# Patient Record
Sex: Female | Born: 1960 | Race: Black or African American | Hispanic: No | Marital: Married | Smoking: Former smoker
Health system: Southern US, Community
[De-identification: ages and names within clinical notes are randomized; demographics above are authoritative.]

## PROBLEM LIST (undated history)

## (undated) DIAGNOSIS — R911 Solitary pulmonary nodule: Secondary | ICD-10-CM

## (undated) DIAGNOSIS — K869 Disease of pancreas, unspecified: Secondary | ICD-10-CM

## (undated) DIAGNOSIS — R51 Headache: Secondary | ICD-10-CM

## (undated) DIAGNOSIS — Z860100 Personal history of colon polyps, unspecified: Secondary | ICD-10-CM

## (undated) DIAGNOSIS — A4902 Methicillin resistant Staphylococcus aureus infection, unspecified site: Secondary | ICD-10-CM

## (undated) DIAGNOSIS — I10 Essential (primary) hypertension: Secondary | ICD-10-CM

## (undated) DIAGNOSIS — M2550 Pain in unspecified joint: Secondary | ICD-10-CM

## (undated) DIAGNOSIS — F329 Major depressive disorder, single episode, unspecified: Secondary | ICD-10-CM

## (undated) DIAGNOSIS — M797 Fibromyalgia: Secondary | ICD-10-CM

## (undated) DIAGNOSIS — M1990 Unspecified osteoarthritis, unspecified site: Secondary | ICD-10-CM

## (undated) DIAGNOSIS — M961 Postlaminectomy syndrome, not elsewhere classified: Secondary | ICD-10-CM

## (undated) DIAGNOSIS — M5413 Radiculopathy, cervicothoracic region: Secondary | ICD-10-CM

## (undated) DIAGNOSIS — G4700 Insomnia, unspecified: Secondary | ICD-10-CM

## (undated) DIAGNOSIS — F419 Anxiety disorder, unspecified: Secondary | ICD-10-CM

## (undated) DIAGNOSIS — K754 Autoimmune hepatitis: Secondary | ICD-10-CM

## (undated) DIAGNOSIS — C189 Malignant neoplasm of colon, unspecified: Secondary | ICD-10-CM

## (undated) DIAGNOSIS — M654 Radial styloid tenosynovitis [de Quervain]: Secondary | ICD-10-CM

## (undated) DIAGNOSIS — R531 Weakness: Secondary | ICD-10-CM

## (undated) DIAGNOSIS — F32A Depression, unspecified: Secondary | ICD-10-CM

## (undated) DIAGNOSIS — E041 Nontoxic single thyroid nodule: Secondary | ICD-10-CM

## (undated) DIAGNOSIS — E119 Type 2 diabetes mellitus without complications: Secondary | ICD-10-CM

## (undated) DIAGNOSIS — C3412 Malignant neoplasm of upper lobe, left bronchus or lung: Secondary | ICD-10-CM

## (undated) DIAGNOSIS — Z91148 Patient's other noncompliance with medication regimen for other reason: Secondary | ICD-10-CM

## (undated) DIAGNOSIS — N289 Disorder of kidney and ureter, unspecified: Secondary | ICD-10-CM

## (undated) DIAGNOSIS — G8929 Other chronic pain: Secondary | ICD-10-CM

## (undated) HISTORY — DX: Disorder of kidney and ureter, unspecified: N28.9

## (undated) HISTORY — PX: TRIGGER FINGER RELEASE: SHX641

## (undated) HISTORY — PX: INCISE AND DRAIN ABCESS: PRO64

## (undated) HISTORY — PX: CARPAL TUNNEL RELEASE: SHX101

## (undated) HISTORY — PX: PROCTOSCOPY TRANSANAL RESECTION OF RECTAL TUMOR WITH TAMIS SYSTEM: SHX6665

## (undated) HISTORY — PX: LOBECTOMY: SHX5089

## (undated) HISTORY — PX: CERVICAL SPINE SURGERY: SHX589

## (undated) HISTORY — PX: XI ROBOTIC ASSISTED THORACOSCOPY- SEGMENTECTOMY: SHX6881

## (undated) HISTORY — PX: COLONOSCOPY: SHX174

## (undated) HISTORY — PX: BRONCHOSCOPY: SUR163

## (undated) HISTORY — PX: HEMORRHOID SURGERY: SHX153

## (undated) HISTORY — PX: TONSILLECTOMY: SUR1361

## (undated) HISTORY — DX: Fibromyalgia: M79.7

## (undated) HISTORY — DX: Methicillin resistant Staphylococcus aureus infection, unspecified site: A49.02

## (undated) HISTORY — DX: Disease of pancreas, unspecified: K86.9

## (undated) HISTORY — DX: Solitary pulmonary nodule: R91.1

## (undated) HISTORY — DX: Malignant neoplasm of colon, unspecified: C18.9

## (undated) HISTORY — DX: Nontoxic single thyroid nodule: E04.1

## (undated) HISTORY — DX: Postlaminectomy syndrome, not elsewhere classified: M96.1

## (undated) HISTORY — PX: OOPHORECTOMY: SHX86

## (undated) HISTORY — DX: Other chronic pain: G89.29

## (undated) HISTORY — DX: Malignant neoplasm of upper lobe, left bronchus or lung: C34.12

## (undated) HISTORY — DX: Type 2 diabetes mellitus without complications: E11.9

## (undated) HISTORY — DX: Essential (primary) hypertension: I10

## (undated) HISTORY — DX: Radiculopathy, cervicothoracic region: M54.13

## (undated) HISTORY — DX: Autoimmune hepatitis: K75.4

## (undated) HISTORY — PX: APPENDECTOMY: SHX54

## (undated) HISTORY — DX: Radial styloid tenosynovitis (de quervain): M65.4

## (undated) HISTORY — DX: Patient's other noncompliance with medication regimen for other reason: Z91.148

## (undated) HISTORY — PX: UPPER GASTROINTESTINAL ENDOSCOPY: SHX188

## (undated) HISTORY — DX: Insomnia, unspecified: G47.00

---

## 1978-09-12 HISTORY — PX: KNEE ARTHROSCOPY: SUR90

## 2000-09-12 HISTORY — PX: ABDOMINAL HYSTERECTOMY: SHX81

## 2003-07-16 ENCOUNTER — Encounter: Admission: RE | Admit: 2003-07-16 | Discharge: 2003-07-16 | Payer: Self-pay | Admitting: Unknown Physician Specialty

## 2004-03-09 ENCOUNTER — Ambulatory Visit (HOSPITAL_COMMUNITY): Admission: RE | Admit: 2004-03-09 | Discharge: 2004-03-11 | Payer: Self-pay | Admitting: Neurological Surgery

## 2004-04-06 ENCOUNTER — Encounter: Admission: RE | Admit: 2004-04-06 | Discharge: 2004-04-06 | Payer: Self-pay | Admitting: Neurological Surgery

## 2004-05-18 ENCOUNTER — Encounter: Admission: RE | Admit: 2004-05-18 | Discharge: 2004-05-18 | Payer: Self-pay | Admitting: Neurological Surgery

## 2008-01-25 ENCOUNTER — Encounter: Admission: RE | Admit: 2008-01-25 | Discharge: 2008-01-25 | Payer: Self-pay | Admitting: Neurological Surgery

## 2008-01-30 ENCOUNTER — Ambulatory Visit (HOSPITAL_COMMUNITY): Admission: RE | Admit: 2008-01-30 | Discharge: 2008-01-31 | Payer: Self-pay | Admitting: Neurological Surgery

## 2008-03-03 ENCOUNTER — Encounter: Admission: RE | Admit: 2008-03-03 | Discharge: 2008-03-03 | Payer: Self-pay | Admitting: Neurological Surgery

## 2008-05-20 ENCOUNTER — Encounter: Admission: RE | Admit: 2008-05-20 | Discharge: 2008-05-20 | Payer: Self-pay | Admitting: Orthopedic Surgery

## 2010-10-02 ENCOUNTER — Encounter: Payer: Self-pay | Admitting: Neurological Surgery

## 2010-10-04 ENCOUNTER — Encounter: Payer: Self-pay | Admitting: Neurological Surgery

## 2011-01-25 NOTE — Op Note (Signed)
NAME:  Lynn Richardson, Lynn Richardson NO.:  [id number]   MEDICAL RECORD NO.:  [id number]          PATIENT TYPE:  OIB   LOCATION:  3535                         FACILITY:  [facility]   PHYSICIAN:  Tia Alert, MD     DATE OF BIRTH:  1961/03/31   DATE OF PROCEDURE:  01/30/2008  DATE OF DISCHARGE:                               OPERATIVE REPORT   PREOPERATIVE DIAGNOSIS:  Cervical spondylosis with cervical disk  herniation at C5-C6 with right C6 radiculopathy.   POSTOPERATIVE DIAGNOSIS:  Cervical spondylosis with cervical disk  herniation at C5-C6 with right C6 radiculopathy.   PROCEDURES:  1. Decompressive anterior cervical diskectomy at C5-C6.  2. Anterior cervical arthrodesis at C5-C6, utilizing a 7-mm      corticocancellous allograft.  3. Anterior cervical plating at C5-C6, utilizing a 23-mm Zephir plate.   SURGEON:  Tia Alert, MD   ASSISTANT:  Donalee Citrin, MD   ANESTHESIA:  General endotracheal.   COMPLICATIONS:  None apparent.   INDICATIONS FOR PROCEDURE:  [name] is a very pleasant 50-  year-old white female who had undergone a previous discectomy at C3-C4,  who presented with severe right arm pain.  She had plain films, which  suggested a solid fusion at C3-C4.  She had an MRI, which showed a large  disk herniation to the right at C5-C6 with severe compression of the  right C6 nerve root.  There was moderate canal stenosis associated with  this.  I recommended an anterior cervical discectomy fusion plating at  C5-C6.  She understood the risks, benefits, and expected outcome and  wished to proceed.   DESCRIPTION OF PROCEDURE:  The patient was taken to the operating room,  and after induction of adequate generalized endotracheal anesthesia, she  was placed in the supine position on the operating room table.  Her  right anterior cervical region was prepped with DuraPrep and then draped  in the usual sterile fashion.  A 3 mL of local anesthesia was  injected,  and a transverse incision was made through the right of midline and  carried down through the platysma muscle, which was elevated, opened,  and then undermined with Metzenbaum scissors.  I then dissected a plane  medial to the sternocleidomastoid muscle and internal carotid artery and  lateral to the trachea and esophagus to expose C5-C6.  Intraoperative  fluoroscopy confirmed a level at C5-C6, and then the longus colli  muscles were taken down bilaterally, and the Shadow-Line retractors were  placed under this to expose C5-C6.  The annulus was incised, and the  initial discectomy was performed with pituitary rongeurs and curettes,  then used a high-speed drill to drill the endplates down to the level of  the posterior longitudinal ligament.  I drilled to a height of 7 mm and  then brought in the operating microscope, opened the posterior  longitudinal ligament with a nerve hook and then removed it while  undercutting the bodies of C5 and C6, removing the posterior osteophytes  as I marched along the endplates, identified the pedicles bilaterally,  marched along the pedicles to identify the nerve roots.  But, spent  considerable time on the  patient's right side because of her right-sided  pain.  I identified the C6 nerve root and followed it out towards distal  to the pedicle, and the nerve was well decompressed.  I then palpated  with a nerve hook in a circumferential fashion to assure adequate  decompression as best I could with visualization and palpation.  I then  measured the interspace to be 7 mm using a 7-mm corticocancellous  allograft and tapped this into position at C5-C6.  I then used a Zephir  plate and placed two 13-mm variable angle screws into the bodies of C5  and C6, and these locked in the plate by the sliding mechanism on the  plate.  I then irrigated with saline solution containing bacitracin,  dried all bleeding points with bipolar cautery, and once  meticulous  hemostasis was achieved, I closed the platysma with 3-0 Vicryl, closed  the subcuticular tissue with 3-0 Vicryl, and closed the skin with  benzoin and Steri-Strips.  The drapes were removed.  Sterile dressing  was applied.  The patient was awakened from general anesthesia and  transferred to the recovery room in stable condition.  At the end of the  procedure, all sponge, needle, and instrument counts were correct.      Tia Alert, MD  Electronically Signed     DSJ/MEDQ  D:  01/30/2008  T:  01/31/2008  Job:  [id number]

## 2011-01-28 NOTE — Op Note (Signed)
NAME:  Lynn Richardson, Lynn Richardson             ACCOUNT NO.:  [id number]   MEDICAL RECORD NO.:  [id number]                   PATIENT TYPE:  OIB   LOCATION:  2899                                 FACILITY:  [facility]   PHYSICIAN:  Tia Alert, MD                  DATE OF BIRTH:  1961-07-19   DATE OF PROCEDURE:  DATE OF DISCHARGE:                                 OPERATIVE REPORT   PREOPERATIVE DIAGNOSIS:  Cervical spondylosis with cervical disk herniation  C3-4 with neck and shoulder pain.   POSTOPERATIVE DIAGNOSIS:  Cervical spondylosis with cervical disk herniation  C3-4 with neck and shoulder pain.   OPERATION PERFORMED:  1. Decompressive anterior cervical diskectomy for central canal and nerve     root decompression.  2. Anterior cervical arthrodesis, C3-4 utilizing Tutogen allograft bone.  3. Anterior cervical plating C3-4 utilizing a 22.5 mm Zephyr plate.   SURGEON:  Tia Alert, MD   ASSISTANT:  Kathaleen Maser. Pool, M.D.   ANESTHESIA:  General endotracheal.   COMPLICATIONS:  None apparent.   INDICATIONS FOR PROCEDURE:  Ms. Lynn Richardson is a 50 year old black  female with a long history of neck pain with bilateral shoulder pain, right  greater than left.  She had tried medical management for quite some time  without significant relief.  She has been to the pain management center  under the care of Ewing Schlein, M.D. and had had multiple injections.  She  had tried medications for quite some time.  Her quality of life was severely  affected by her pain.  She had an MRI and then a CT myelogram which showed a  cervical disk herniation at C3-4 causing moderate spinal stenosis.  I  recommended anterior cervical diskectomy with fusion and plating at C3-4.  She understood the risks and benefits and expected outcomes and the  alternatives and wished to proceed.   DESCRIPTION OF PROCEDURE:  The patient was taken to the operating room and  after induction of adequate general  endotracheal anesthesia, she was placed  in supine position on the operating table.  Her right anterior cervical  region was prepped with DuraPrep and then draped in the usual sterile  fashion.  5 mL of local anesthesia was injected and then an incision was  made to the right of midline and carried down to the platysma.  The platysma  was elevated, opened and undermined with Metzenbaum scissors.  I then  dissected into plane medial to the sternocleidomastoid muscle, internal  carotid artery and lateral to the trachea and esophagus to expose the  anterior cervical spine at C3-4.  Intraoperative fluoroscopy confirmed our  level and then the longus colli muscles were taken down bilaterally to  expose the anterior cervical spine at C3-4.  The annulus was incised with a  15 blade scalpel and initial diskectomy was done with a pituitary rongeurs  and curved curets.  We dissected down to the posterior longitudinal  ligament.  We  brought in the high speed air powered Black Max drill and  drilled down the end places for later arthrodesis.  We drilled down to the  level of the posterior longitudinal ligament and brought in the operating  room microscope.  Under the operating microscope the posterior longitudinal  ligament was opened and removed in a circumferential fashion along with the  posterior osteophytes with a Kerrison punches.  Once the decompression was  complete, we turned our attention to the arthrodesis.  We measured the  interspace to be 7 mm and used a 7 mm Tutogen allograft and tapped this into  position at C3-4.  We then used a 22.5 mm Zephyr plate and placed this at C3-  4 with two 13 mm screws in the body of C3 and two in the body of C4 and then  locked these into position with a locking mechanism.  We then irrigated with  copious amounts of bacitracin containing saline solution, dried all bleeding  points with bipolar cautery, closed the platysma with interrupted 3-0  Vicryl,  closed the subcuticular tissue with 3-0 Vicryl and closed the  subcuticular tissue with 3-0 Vicryl and closed the skin with Dermabond.  The  drapes were removed.  The patient was awakened from general anesthesia and  transported to recovery room in stable condition.  At the end of the  procedure all sponge, needle and instrument counts were correct.                                               Tia Alert, MD    DSJ/MEDQ  D:  03/09/2004  T:  03/09/2004  Job:  [id number]

## 2011-06-08 LAB — DIFFERENTIAL
Basophils Absolute: 0.1
Basophils Relative: 1
Eosinophils Absolute: 0.2
Eosinophils Relative: 2
Lymphocytes Relative: 34
Lymphs Abs: 4.1 — ABNORMAL HIGH
Monocytes Absolute: 0.8
Monocytes Relative: 7
Neutro Abs: 6.9
Neutrophils Relative %: 57

## 2011-06-08 LAB — CBC
HCT: 39.6
Hemoglobin: 13.7
MCHC: 34.6
MCV: 85.1
Platelets: 223
RBC: 4.66
RDW: 12.9
WBC: 12.1 — ABNORMAL HIGH

## 2011-06-08 LAB — BASIC METABOLIC PANEL
BUN: 5 — ABNORMAL LOW
CO2: 28
Calcium: 9.5
Chloride: 106
Creatinine, Ser: 0.94
GFR calc Af Amer: >60
GFR calc non Af Amer: >60
Glucose, Bld: 115 — ABNORMAL HIGH
Potassium: 3.7
Sodium: 144

## 2011-06-08 LAB — PROTIME-INR
INR: 0.9
Prothrombin Time: 12.1

## 2011-06-08 LAB — APTT: aPTT: 32

## 2012-01-04 ENCOUNTER — Other Ambulatory Visit (HOSPITAL_COMMUNITY): Payer: Self-pay | Admitting: Neurological Surgery

## 2012-01-04 DIAGNOSIS — M542 Cervicalgia: Secondary | ICD-10-CM

## 2012-01-04 DIAGNOSIS — M5412 Radiculopathy, cervical region: Secondary | ICD-10-CM

## 2012-01-06 ENCOUNTER — Ambulatory Visit (HOSPITAL_COMMUNITY)
Admission: RE | Admit: 2012-01-06 | Discharge: 2012-01-06 | Disposition: A | Discharge status: Home / Self Care | Payer: BC Managed Care – PPO | Source: Ambulatory Visit | Attending: Neurological Surgery | Admitting: Neurological Surgery

## 2012-01-06 DIAGNOSIS — Z981 Arthrodesis status: Secondary | ICD-10-CM | POA: Insufficient documentation

## 2012-01-06 DIAGNOSIS — M5412 Radiculopathy, cervical region: Secondary | ICD-10-CM

## 2012-01-06 DIAGNOSIS — M542 Cervicalgia: Secondary | ICD-10-CM

## 2012-01-06 DIAGNOSIS — M5020 Other cervical disc displacement, unspecified cervical region: Secondary | ICD-10-CM | POA: Insufficient documentation

## 2012-01-16 ENCOUNTER — Encounter (HOSPITAL_COMMUNITY): Payer: Self-pay | Admitting: Respiratory Therapy

## 2012-01-17 ENCOUNTER — Other Ambulatory Visit: Payer: Self-pay | Admitting: Neurological Surgery

## 2012-01-18 ENCOUNTER — Encounter (HOSPITAL_COMMUNITY): Payer: Self-pay

## 2012-01-18 ENCOUNTER — Encounter (HOSPITAL_COMMUNITY)
Admission: RE | Admit: 2012-01-18 | Discharge: 2012-01-18 | Disposition: A | Discharge status: Home / Self Care | Payer: BC Managed Care – PPO | Source: Ambulatory Visit | Attending: Neurological Surgery | Admitting: Neurological Surgery

## 2012-01-18 HISTORY — DX: Depression, unspecified: F32.A

## 2012-01-18 HISTORY — DX: Unspecified osteoarthritis, unspecified site: M19.90

## 2012-01-18 HISTORY — DX: Major depressive disorder, single episode, unspecified: F32.9

## 2012-01-18 HISTORY — DX: Anxiety disorder, unspecified: F41.9

## 2012-01-18 LAB — CBC
HCT: 38.5 % (ref 36.0–46.0)
MCHC: 33.8 g/dL (ref 30.0–36.0)
MCV: 83.2 fL (ref 78.0–100.0)
Platelets: 202 10*3/uL (ref 150–400)
RDW: 13.3 % (ref 11.5–15.5)

## 2012-01-18 LAB — DIFFERENTIAL
Basophils Absolute: 0 10*3/uL (ref 0.0–0.1)
Basophils Relative: 0 % (ref 0–1)
Eosinophils Relative: 3 % (ref 0–5)
Monocytes Absolute: 0.6 10*3/uL (ref 0.1–1.0)

## 2012-01-18 LAB — BASIC METABOLIC PANEL
BUN: 7 mg/dL (ref 6–23)
CO2: 27 mEq/L (ref 19–32)
Calcium: 9.3 mg/dL (ref 8.4–10.5)
Chloride: 101 mEq/L (ref 96–112)
Creatinine, Ser: 0.92 mg/dL (ref 0.50–1.10)
GFR calc Af Amer: 82 mL/min — ABNORMAL LOW (ref >90)

## 2012-01-18 NOTE — Pre-Procedure Instructions (Signed)
20 Lynn Richardson  01/18/2012   Your procedure is scheduled on:  01/20/12 (Friday)  Report to [facility] at 05:30 AM.  Call this number if you have problems the morning of surgery: [phone]   Remember:   Do not eat food:After Midnight.  May have clear liquids: up to 4 Hours before arrival.  Clear liquids include soda, tea, black coffee, apple or grape juice, broth.  Take these medicines the morning of surgery with A SIP OF WATER: Percocet, Valium, Estradiol    Do not wear jewelry, make-up or nail polish.  Do not wear lotions, powders, or perfumes. You may wear deodorant.  Do not shave 48 hours prior to surgery.  Do not bring valuables to the hospital.  Contacts, dentures or bridgework may not be worn into surgery.  Leave suitcase in the car. After surgery it may be brought to your room.  For patients admitted to the hospital, checkout time is 11:00 AM the day of discharge.   Patients discharged the day of surgery will not be allowed to drive home.  Name and phone number of your driver: FAMILY  Special Instructions: CHG Shower Use Special Wash: 1/2 bottle night before surgery and 1/2 bottle morning of surgery.   Please read over the following fact sheets that you were given: Pain Booklet, Coughing and Deep Breathing, MRSA Information and Surgical Site Infection Prevention

## 2012-01-19 MED ORDER — CEFAZOLIN SODIUM-DEXTROSE 2-3 GM-% IV SOLR
2.0000 g | INTRAVENOUS | Status: AC
  Administered 2012-01-20: 2 g via INTRAVENOUS
  Filled 2012-01-19 (×2): qty 50

## 2012-01-20 ENCOUNTER — Encounter (HOSPITAL_COMMUNITY): Payer: Self-pay | Admitting: *Deleted

## 2012-01-20 ENCOUNTER — Encounter (HOSPITAL_COMMUNITY): Payer: Self-pay | Admitting: Anesthesiology

## 2012-01-20 ENCOUNTER — Ambulatory Visit (HOSPITAL_COMMUNITY): Payer: BC Managed Care – PPO

## 2012-01-20 ENCOUNTER — Encounter (HOSPITAL_COMMUNITY)
Admission: RE | Disposition: A | Discharge status: Home / Self Care | Payer: Self-pay | Source: Ambulatory Visit | Attending: Neurological Surgery

## 2012-01-20 ENCOUNTER — Ambulatory Visit (HOSPITAL_COMMUNITY)
Admission: RE | Admit: 2012-01-20 | Discharge: 2012-01-21 | Disposition: A | Discharge status: Home / Self Care | Payer: BC Managed Care – PPO | Source: Ambulatory Visit | Attending: Neurological Surgery | Admitting: Neurological Surgery

## 2012-01-20 ENCOUNTER — Ambulatory Visit (HOSPITAL_COMMUNITY): Payer: BC Managed Care – PPO | Admitting: Anesthesiology

## 2012-01-20 DIAGNOSIS — Z87891 Personal history of nicotine dependence: Secondary | ICD-10-CM | POA: Insufficient documentation

## 2012-01-20 DIAGNOSIS — R0602 Shortness of breath: Secondary | ICD-10-CM | POA: Insufficient documentation

## 2012-01-20 DIAGNOSIS — Z01810 Encounter for preprocedural cardiovascular examination: Secondary | ICD-10-CM | POA: Insufficient documentation

## 2012-01-20 DIAGNOSIS — M5020 Other cervical disc displacement, unspecified cervical region: Secondary | ICD-10-CM | POA: Insufficient documentation

## 2012-01-20 DIAGNOSIS — Z01812 Encounter for preprocedural laboratory examination: Secondary | ICD-10-CM | POA: Insufficient documentation

## 2012-01-20 DIAGNOSIS — Z01818 Encounter for other preprocedural examination: Secondary | ICD-10-CM | POA: Insufficient documentation

## 2012-01-20 HISTORY — PX: POSTERIOR CERVICAL LAMINECTOMY: SHX2248

## 2012-01-20 SURGERY — POSTERIOR CERVICAL LAMINECTOMY
Anesthesia: General | Site: Spine Cervical | Laterality: Right | Wound class: Clean

## 2012-01-20 MED ORDER — METHOCARBAMOL 500 MG PO TABS
500.0000 mg | ORAL_TABLET | Freq: Four times a day (QID) | ORAL | Status: DC | PRN
  Administered 2012-01-21 (×2): 500 mg via ORAL
  Filled 2012-01-20 (×2): qty 1

## 2012-01-20 MED ORDER — GLYCOPYRROLATE 0.2 MG/ML IJ SOLN
INTRAMUSCULAR | Status: DC | PRN
  Administered 2012-01-20: .8 mg via INTRAVENOUS

## 2012-01-20 MED ORDER — LACTATED RINGERS IV SOLN
INTRAVENOUS | Status: DC | PRN
  Administered 2012-01-20 (×2): via INTRAVENOUS

## 2012-01-20 MED ORDER — HYDROMORPHONE HCL PF 1 MG/ML IJ SOLN
INTRAMUSCULAR | Status: AC
  Filled 2012-01-20: qty 1

## 2012-01-20 MED ORDER — NEOSTIGMINE METHYLSULFATE 1 MG/ML IJ SOLN
INTRAMUSCULAR | Status: DC | PRN
  Administered 2012-01-20: 4 mg via INTRAVENOUS

## 2012-01-20 MED ORDER — CITALOPRAM HYDROBROMIDE 20 MG PO TABS
20.0000 mg | ORAL_TABLET | Freq: Every day | ORAL | Status: DC
  Administered 2012-01-20 – 2012-01-21 (×2): 20 mg via ORAL
  Filled 2012-01-20 (×2): qty 1

## 2012-01-20 MED ORDER — OXYCODONE-ACETAMINOPHEN 5-325 MG PO TABS
1.0000 | ORAL_TABLET | ORAL | Status: DC | PRN
  Administered 2012-01-20 – 2012-01-21 (×4): 2 via ORAL
  Filled 2012-01-20 (×4): qty 2

## 2012-01-20 MED ORDER — DEXAMETHASONE SODIUM PHOSPHATE 4 MG/ML IJ SOLN
4.0000 mg | Freq: Four times a day (QID) | INTRAMUSCULAR | Status: DC
  Administered 2012-01-20: 4 mg via INTRAVENOUS
  Filled 2012-01-20 (×5): qty 1

## 2012-01-20 MED ORDER — BACITRACIN 50000 UNITS IM SOLR
INTRAMUSCULAR | Status: AC
  Filled 2012-01-20: qty 1

## 2012-01-20 MED ORDER — THROMBIN 5000 UNITS EX SOLR
OROMUCOSAL | Status: DC | PRN
  Administered 2012-01-20: 09:00:00 via TOPICAL

## 2012-01-20 MED ORDER — LIDOCAINE HCL (CARDIAC) 20 MG/ML IV SOLN
INTRAVENOUS | Status: DC | PRN
  Administered 2012-01-20: 80 mg via INTRAVENOUS

## 2012-01-20 MED ORDER — MORPHINE SULFATE 2 MG/ML IJ SOLN
1.0000 mg | INTRAMUSCULAR | Status: DC | PRN
  Administered 2012-01-20 (×2): 4 mg via INTRAVENOUS
  Filled 2012-01-20 (×2): qty 2

## 2012-01-20 MED ORDER — FENTANYL CITRATE 0.05 MG/ML IJ SOLN
INTRAMUSCULAR | Status: DC | PRN
  Administered 2012-01-20: 175 ug via INTRAVENOUS
  Administered 2012-01-20: 25 ug via INTRAVENOUS

## 2012-01-20 MED ORDER — DEXAMETHASONE 4 MG PO TABS
4.0000 mg | ORAL_TABLET | Freq: Four times a day (QID) | ORAL | Status: DC
  Administered 2012-01-20 – 2012-01-21 (×3): 4 mg via ORAL
  Filled 2012-01-20 (×7): qty 1

## 2012-01-20 MED ORDER — SODIUM CHLORIDE 0.9 % IJ SOLN
3.0000 mL | Freq: Two times a day (BID) | INTRAMUSCULAR | Status: DC
  Administered 2012-01-20: 3 mL via INTRAVENOUS

## 2012-01-20 MED ORDER — POTASSIUM CHLORIDE IN NACL 20-0.9 MEQ/L-% IV SOLN
INTRAVENOUS | Status: DC
  Filled 2012-01-20 (×3): qty 1000

## 2012-01-20 MED ORDER — ONDANSETRON HCL 4 MG/2ML IJ SOLN: 4.0000 mg | INTRAMUSCULAR | Status: DC | PRN

## 2012-01-20 MED ORDER — DEXAMETHASONE SODIUM PHOSPHATE 10 MG/ML IJ SOLN
10.0000 mg | INTRAMUSCULAR | Status: AC
  Administered 2012-01-20: 10 mg via INTRAVENOUS

## 2012-01-20 MED ORDER — SODIUM CHLORIDE 0.9 % IJ SOLN: 3.0000 mL | INTRAMUSCULAR | Status: DC | PRN

## 2012-01-20 MED ORDER — ROCURONIUM BROMIDE 100 MG/10ML IV SOLN
INTRAVENOUS | Status: DC | PRN
  Administered 2012-01-20 (×2): 10 mg via INTRAVENOUS
  Administered 2012-01-20: 50 mg via INTRAVENOUS

## 2012-01-20 MED ORDER — MENTHOL 3 MG MT LOZG
1.0000 | LOZENGE | OROMUCOSAL | Status: DC | PRN
  Filled 2012-01-20: qty 9

## 2012-01-20 MED ORDER — CYCLOBENZAPRINE HCL 10 MG PO TABS: 10.0000 mg | ORAL_TABLET | Freq: Three times a day (TID) | ORAL | Status: DC | PRN

## 2012-01-20 MED ORDER — THROMBIN 5000 UNITS EX SOLR
CUTANEOUS | Status: DC | PRN
  Administered 2012-01-20 (×2): 5000 [IU] via TOPICAL

## 2012-01-20 MED ORDER — CEFAZOLIN SODIUM 1-5 GM-% IV SOLN
1.0000 g | Freq: Three times a day (TID) | INTRAVENOUS | Status: AC
  Administered 2012-01-20 (×2): 1 g via INTRAVENOUS
  Filled 2012-01-20 (×2): qty 50

## 2012-01-20 MED ORDER — ACETAMINOPHEN 325 MG PO TABS: 650.0000 mg | ORAL_TABLET | ORAL | Status: DC | PRN

## 2012-01-20 MED ORDER — ACETAMINOPHEN 650 MG RE SUPP: 650.0000 mg | RECTAL | Status: DC | PRN

## 2012-01-20 MED ORDER — ONDANSETRON HCL 4 MG/2ML IJ SOLN: 4.0000 mg | Freq: Once | INTRAMUSCULAR | Status: DC | PRN

## 2012-01-20 MED ORDER — DEXAMETHASONE SODIUM PHOSPHATE 10 MG/ML IJ SOLN
INTRAMUSCULAR | Status: AC
  Filled 2012-01-20: qty 1

## 2012-01-20 MED ORDER — HYDROMORPHONE HCL PF 1 MG/ML IJ SOLN
INTRAMUSCULAR | Status: AC
  Administered 2012-01-20: 0.5 mg
  Filled 2012-01-20: qty 1

## 2012-01-20 MED ORDER — HEMOSTATIC AGENTS (NO CHARGE) OPTIME
TOPICAL | Status: DC | PRN
  Administered 2012-01-20: 1 via TOPICAL

## 2012-01-20 MED ORDER — SODIUM CHLORIDE 0.9 % IV SOLN
INTRAVENOUS | Status: AC
  Filled 2012-01-20: qty 500

## 2012-01-20 MED ORDER — PHENOL 1.4 % MT LIQD: 1.0000 | OROMUCOSAL | Status: DC | PRN

## 2012-01-20 MED ORDER — ONDANSETRON HCL 4 MG/2ML IJ SOLN
INTRAMUSCULAR | Status: DC | PRN
  Administered 2012-01-20: 4 mg via INTRAVENOUS

## 2012-01-20 MED ORDER — HYDROMORPHONE HCL PF 1 MG/ML IJ SOLN
0.2500 mg | INTRAMUSCULAR | Status: DC | PRN
  Administered 2012-01-20 (×3): 0.5 mg via INTRAVENOUS

## 2012-01-20 MED ORDER — PROPOFOL 10 MG/ML IV BOLUS
INTRAVENOUS | Status: DC | PRN
  Administered 2012-01-20: 200 mg via INTRAVENOUS

## 2012-01-20 MED ORDER — LIDOCAINE HCL 4 % MT SOLN
OROMUCOSAL | Status: DC | PRN
  Administered 2012-01-20: 4 mL via TOPICAL

## 2012-01-20 MED ORDER — 0.9 % SODIUM CHLORIDE (POUR BTL) OPTIME
TOPICAL | Status: DC | PRN
  Administered 2012-01-20: 1000 mL

## 2012-01-20 MED ORDER — BUPIVACAINE HCL (PF) 0.25 % IJ SOLN
INTRAMUSCULAR | Status: DC | PRN
  Administered 2012-01-20: 6 mL

## 2012-01-20 MED ORDER — ESTRADIOL 1 MG PO TABS
1.0000 mg | ORAL_TABLET | Freq: Every day | ORAL | Status: DC
  Administered 2012-01-21: 1 mg via ORAL
  Filled 2012-01-20: qty 1

## 2012-01-20 SURGICAL SUPPLY — 53 items
APL SKNCLS STERI-STRIP NONHPOA (GAUZE/BANDAGES/DRESSINGS) ×1
BAG DECANTER FOR FLEXI CONT (MISCELLANEOUS) ×2 IMPLANT
BENZOIN TINCTURE PRP APPL 2/3 (GAUZE/BANDAGES/DRESSINGS) ×2 IMPLANT
BLADE SURG ROTATE 9660 (MISCELLANEOUS) IMPLANT
BUR MATCHSTICK NEURO 3.0 LAGG (BURR) ×1 IMPLANT
CANISTER SUCTION 2500CC (MISCELLANEOUS) ×2 IMPLANT
CLOTH BEACON ORANGE TIMEOUT ST (SAFETY) ×2 IMPLANT
CONT SPEC 4OZ CLIKSEAL STRL BL (MISCELLANEOUS) ×2 IMPLANT
CORDS BIPOLAR (ELECTRODE) ×1 IMPLANT
DRAPE LAPAROTOMY 100X72 PEDS (DRAPES) ×2 IMPLANT
DRAPE MICROSCOPE LEICA (MISCELLANEOUS) ×1 IMPLANT
DRAPE MICROSCOPE ZEISS OPMI (DRAPES) IMPLANT
DRAPE POUCH INSTRU U-SHP 10X18 (DRAPES) ×2 IMPLANT
DRESSING TELFA 8X3 (GAUZE/BANDAGES/DRESSINGS) ×2 IMPLANT
DRSG OPSITE 4X5.5 SM (GAUZE/BANDAGES/DRESSINGS) ×3 IMPLANT
DURAPREP 26ML APPLICATOR (WOUND CARE) ×2 IMPLANT
ELECT REM PT RETURN 9FT ADLT (ELECTROSURGICAL) ×2
ELECTRODE REM PT RTRN 9FT ADLT (ELECTROSURGICAL) ×1 IMPLANT
GAUZE SPONGE 4X4 16PLY XRAY LF (GAUZE/BANDAGES/DRESSINGS) IMPLANT
GLOVE BIO SURGEON STRL SZ8 (GLOVE) ×2 IMPLANT
GLOVE BIOGEL PI IND STRL 8 (GLOVE) IMPLANT
GLOVE BIOGEL PI INDICATOR 8 (GLOVE) ×1
GLOVE ECLIPSE 7.5 STRL STRAW (GLOVE) ×1 IMPLANT
GLOVE INDICATOR 7.5 STRL GRN (GLOVE) ×2 IMPLANT
GLOVE SS BIOGEL STRL SZ 7 (GLOVE) IMPLANT
GLOVE SUPERSENSE BIOGEL SZ 7 (GLOVE) ×4
GOWN BRE IMP SLV AUR LG STRL (GOWN DISPOSABLE) ×3 IMPLANT
GOWN BRE IMP SLV AUR XL STRL (GOWN DISPOSABLE) ×3 IMPLANT
GOWN STRL REIN 2XL LVL4 (GOWN DISPOSABLE) IMPLANT
HEMOSTAT POWDER KIT SURGIFOAM (HEMOSTASIS) ×1 IMPLANT
KIT BASIN OR (CUSTOM PROCEDURE TRAY) ×2 IMPLANT
KIT ROOM TURNOVER OR (KITS) ×2 IMPLANT
NDL SPNL 20GX3.5 QUINCKE YW (NEEDLE) ×1 IMPLANT
NEEDLE HYPO 22GX1.5 SAFETY (NEEDLE) ×2 IMPLANT
NEEDLE SPNL 20GX3.5 QUINCKE YW (NEEDLE) ×2 IMPLANT
NS IRRIG 1000ML POUR BTL (IV SOLUTION) ×2 IMPLANT
PACK LAMINECTOMY NEURO (CUSTOM PROCEDURE TRAY) ×2 IMPLANT
PAD ARMBOARD 7.5X6 YLW CONV (MISCELLANEOUS) ×2 IMPLANT
PIN MAYFIELD SKULL DISP (PIN) ×2 IMPLANT
RUBBERBAND STERILE (MISCELLANEOUS) ×2 IMPLANT
SPONGE GAUZE 4X4 12PLY (GAUZE/BANDAGES/DRESSINGS) ×2 IMPLANT
SPONGE LAP 4X18 X RAY DECT (DISPOSABLE) IMPLANT
SPONGE SURGIFOAM ABS GEL SZ50 (HEMOSTASIS) ×2 IMPLANT
STRIP CLOSURE SKIN 1/2X4 (GAUZE/BANDAGES/DRESSINGS) ×2 IMPLANT
SUT VIC AB 0 CT1 18XCR BRD8 (SUTURE) ×1 IMPLANT
SUT VIC AB 0 CT1 8-18 (SUTURE) ×2
SUT VIC AB 2-0 CP2 18 (SUTURE) ×2 IMPLANT
SUT VIC AB 3-0 SH 8-18 (SUTURE) ×2 IMPLANT
SWABSTICK BENZOIN STERILE (MISCELLANEOUS) ×2 IMPLANT
SYR 20ML ECCENTRIC (SYRINGE) ×2 IMPLANT
TOWEL OR 17X24 6PK STRL BLUE (TOWEL DISPOSABLE) ×2 IMPLANT
TOWEL OR 17X26 10 PK STRL BLUE (TOWEL DISPOSABLE) ×2 IMPLANT
WATER STERILE IRR 1000ML POUR (IV SOLUTION) ×2 IMPLANT

## 2012-01-20 NOTE — Anesthesia Postprocedure Evaluation (Signed)
  Anesthesia Post-op Note  Patient: Lynn Richardson  Procedure(s) Performed: Procedure(s) (LRB): POSTERIOR CERVICAL LAMINECTOMY (Right)  Patient Location: PACU  Anesthesia Type: General  Level of Consciousness: awake, alert , oriented and patient cooperative  Airway and Oxygen Therapy: Patient Spontanous Breathing and Patient connected to nasal cannula oxygen  Post-op Pain: mild  Post-op Assessment: Post-op Vital signs reviewed, Patient's Cardiovascular Status Stable, Respiratory Function Stable, Patent Airway, No signs of Nausea or vomiting and Pain level controlled  Post-op Vital Signs: stable  Complications: No apparent anesthesia complications

## 2012-01-20 NOTE — Preoperative (Signed)
Beta Blockers   Reason not to administer Beta Blockers:Not Applicable 

## 2012-01-20 NOTE — Progress Notes (Signed)
Pt. Awakened from snoring, starts crying and yelling" please help me it hurts". Dilaudid given and pt. Back to sleep.

## 2012-01-20 NOTE — H&P (Signed)
Subjective:   Patient is a 51 y.o. female admitted for R C7-T1 foraminotomy and diskectomy. The patient first presented to me with complaints of neck and RUE pain. Onset of symptoms was several weeks ago. The pain is described as aching and occurs throughout the day. The pain is rated moderately severe. The symptoms have been progressive. Symptoms are exacerbated by neck extension, and are relieved by meds somewhat.  Previous work up includes MRI. She has weakness in the R hand with numbness in the 4th and 5th digits.  Past Medical History  Diagnosis Date  . Shortness of breath     with exertion.   . Arthritis   . Anxiety   . Depression     Past Surgical History  Procedure Date  . Cervical spine surgery 2008 and 2010  . Abdominal hysterectomy 2002  . Knee arthroscopy 1980  . Tonsillectomy   . Appendectomy     No Known Allergies  History  Substance Use Topics  . Smoking status: Former Games developer  . Smokeless tobacco: Not on file   Comment: Quit 3 years ago   . Alcohol Use: No    History reviewed. No pertinent family history. Prior to Admission medications   Medication Sig Start Date End Date Taking? Authorizing Provider  citalopram (CELEXA) 20 MG tablet Take 20 mg by mouth daily.   Yes Historical Provider, MD  diazepam (VALIUM) 10 MG tablet Take 10 mg by mouth as needed.   Yes Historical Provider, MD  estradiol (ESTRACE) 1 MG tablet Take 1 mg by mouth daily.   Yes Historical Provider, MD  oxyCODONE-acetaminophen (PERCOCET) 5-325 MG per tablet Take 1 tablet by mouth every 4 (four) hours as needed.   Yes Historical Provider, MD     Review of Systems  Positive ROS: neg  All other systems have been reviewed and were otherwise negative with the exception of those mentioned in the HPI and as above.  Objective: Vital signs in last 24 hours: Temp:  [97.6 F (36.4 C)] 97.6 F (36.4 C) (05/10 0619) Pulse Rate:  [93] 93  (05/10 0619) Resp:  [18] 18  (05/10 0619) BP: (130)/(80)  130/80 mmHg (05/10 0619) SpO2:  [95 %] 95 % (05/10 0619)  General Appearance: Alert, cooperative, no distress, appears stated age Head: Normocephalic, without obvious abnormality, atraumatic Eyes: PERRL, conjunctiva/corneas clear, EOM's intact, fundi benign, both eyes      Ears: Normal TM's and external ear canals, both ears Throat: Lips, mucosa, and tongue normal; teeth and gums normal Neck: Supple, symmetrical, trachea midline, no adenopathy; thyroid: No enlargement/tenderness/nodules; no carotid bruit or JVD Back: Symmetric, no curvature, ROM normal, no CVA tenderness Lungs: Clear to auscultation bilaterally, respirations unlabored Heart: Regular rate and rhythm, S1 and S2 normal, no murmur, rub or gallop Abdomen: Soft, non-tender, bowel sounds active all four quadrants, no masses, no organomegaly Extremities: Extremities normal, atraumatic, no cyanosis or edema Pulses: 2+ and symmetric all extremities Skin: Skin color, texture, turgor normal, no rashes or lesions  NEUROLOGIC:  Mental status: Alert and oriented x4, no aphasia, good attention span, fund of knowledge and memory  Motor Exam - grossly normal except weakness of R hand Sensory Exam - grossly normal except decreased R 4th and 5th digits Reflexes: 1+ Coordination - grossly normal Gait - grossly normal Balance - grossly normal Cranial Nerves: I: smell Not tested  II: visual acuity  OS: nl    OD: nl  II: visual fields Full to confrontation  II: pupils Equal, round,  reactive to light  III,VII: ptosis None  III,IV,VI: extraocular muscles  Full ROM  V: mastication Normal  V: facial light touch sensation  Normal  V,VII: corneal reflex  Present  VII: facial muscle function - upper  Normal  VII: facial muscle function - lower Normal  VIII: hearing Not tested  IX: soft palate elevation  Normal  IX,X: gag reflex Present  XI: trapezius strength  5/5  XI: sternocleidomastoid strength 5/5  XI: neck flexion strength  5/5    XII: tongue strength  Normal    Data Review Lab Results  Component Value Date   WBC 11.8* 01/18/2012   HGB 13.0 01/18/2012   HCT 38.5 01/18/2012   MCV 83.2 01/18/2012   PLT 202 01/18/2012   Lab Results  Component Value Date   NA 138 01/18/2012   K 3.6 01/18/2012   CL 101 01/18/2012   CO2 27 01/18/2012   BUN 7 01/18/2012   CREATININE 0.92 01/18/2012   GLUCOSE 206* 01/18/2012   Lab Results  Component Value Date   INR 1.06 01/18/2012    Assessment:   Cervical neck pain with herniated nucleus pulposus/ spondylosis/ stenosis at C7-T1. Patient has failed conservative therapy. Planned surgery : C7-T1 foraminotomy and diskectomy.  Plan:   I explained the condition and procedure to the patient and answered any questions.  Patient wishes to proceed with procedure as planned. Understands risks/ benefits/ and expected or typical outcomes.  Marvyn Torrez S 01/20/2012 6:30 AM

## 2012-01-20 NOTE — Transfer of Care (Signed)
Immediate Anesthesia Transfer of Care Note  Patient: Lynn Richardson  Procedure(s) Performed: Procedure(s) (LRB): POSTERIOR CERVICAL LAMINECTOMY (Right)  Patient Location: PACU  Anesthesia Type: General  Level of Consciousness: awake, alert , oriented and patient cooperative  Airway & Oxygen Therapy: Patient Spontanous Breathing and Patient connected to nasal cannula oxygen  Post-op Assessment: Report given to PACU RN, Post -op Vital signs reviewed and stable and Patient moving all extremities X 4  Post vital signs: Reviewed and stable  Complications: No apparent anesthesia complications

## 2012-01-20 NOTE — Anesthesia Preprocedure Evaluation (Addendum)
Anesthesia Evaluation  Patient identified by MRN, date of birth, ID band Patient awake    Reviewed: Allergy & Precautions, H&P , NPO status , Patient's Chart, lab work & pertinent test results  Airway Mallampati: II TM Distance: >3 FB Neck ROM: full    Dental  (+) Dental Advisory Given   Pulmonary shortness of breath and with exertion, former smoker         Cardiovascular Exercise Tolerance: Good Rhythm:regular Rate:Normal     Neuro/Psych    GI/Hepatic   Endo/Other  Morbid obesity  Renal/GU      Musculoskeletal   Abdominal (+) + obese,   Peds  Hematology   Anesthesia Other Findings   Reproductive/Obstetrics                          Anesthesia Physical Anesthesia Plan  ASA: II  Anesthesia Plan: General   Post-op Pain Management:    Induction: Intravenous  Airway Management Planned: Oral ETT  Additional Equipment:   Intra-op Plan:   Post-operative Plan: Extubation in OR  Informed Consent: I have reviewed the patients History and Physical, chart, labs and discussed the procedure including the risks, benefits and alternatives for the proposed anesthesia with the patient or authorized representative who has indicated his/her understanding and acceptance.   Dental advisory given  Plan Discussed with: CRNA, Anesthesiologist and Surgeon  Anesthesia Plan Comments:        Anesthesia Quick Evaluation

## 2012-01-20 NOTE — Op Note (Signed)
01/20/2012  9:51 AM  PATIENT:  Lynn Richardson  51 y.o. female  PRE-OPERATIVE DIAGNOSIS:  Right C7-T1 herniated nucleus pulposus with right C8 radiculopathy  POST-OPERATIVE DIAGNOSIS:  Same  PROCEDURE:  Right C7-T1 foraminotomy and microdiscectomy utilizing microdissection to decompress the C8 nerve root  SURGEON:  Marikay Alar, MD  ASSISTANTS: Dr. Jule Ser  ANESTHESIA:   General  EBL: 75 ml  Total I/O In: 1000 [I.V.:1000] Out: 75 [Blood:75]  BLOOD ADMINISTERED:none  DRAINS: None   SPECIMEN:  No Specimen  INDICATION FOR PROCEDURE: This patient presented with neck pain and radiation into the right upper extremity with progressive weakness in her right hand. MRI showed a herniated disc at C7-T1 on the right. I recommended a foraminotomies and discectomy at that level. Patient understood the risks, benefits, and alternatives and potential outcomes and wished to proceed.  PROCEDURE DETAILS: The patient was brought to the operating room. Generalized endotracheal anesthesia was induced. The patient was affixed a 3 point [place] headrest and rolled into the prone position on chest rolls. All pressure points were padded. The posterior cervical region was cleaned and prepped with DuraPrep and then draped in the usual sterile fashion. 7 cc of local anesthesia was injected and a dorsal midline incision made in the posterior cervical region and carried down to the cervical fascia. The fascia was opened and the paraspinous musculature was taken down to expose C7-T1 on the right. Intraoperative x-ray confirmed my level. I then used the high-speed drill and 1 and 2 mm Kerrison punches to perform a keyhole foraminotomies C7-T1 on the right. The yellow ligament was opened to expose the underlying dura and C8 nerve root. Utilizing the operating microscope thousand with the nerve hook to reach up under the dural and and C8 nerve root and was then removed several fragments of disc herniation.  The nerve was then well decompressed found no more herniation or compression of the nerve root or the thecal sac. I.the surgical bed with Gelfoam and bipolar cautery. I irrigated with saline solution containing bacitracin. I lined the dura with Gelfoam. After hemostasis was achieved a closed the muscle and the fascia with 0 Vicryl, subcutaneous tissue with 2-0 Vicryl, and the subcuticular tissue with 3-0 Vicryl. The skin was closed with benzoin and Steri-Strips. A sterile dressing was applied, the patient was turned to the supine physician and taken out of the headrest, awakened from general anesthesia and transferred to the recovery room in stable condition. At the end of the procedure all sponge, needle and instrument counts were correct.  PLAN OF CARE: Admit for overnight observation  PATIENT DISPOSITION:  PACU - hemodynamically stable.   Delay start of Pharmacological VTE agent (>24hrs) due to surgical blood loss or risk of bleeding:  yes

## 2012-01-20 NOTE — Anesthesia Procedure Notes (Signed)
Procedure Name: Intubation Date/Time: 01/20/2012 7:49 AM Performed by: Rogelia Boga Pre-anesthesia Checklist: Patient identified, Emergency Drugs available, Suction available, Patient being monitored and Timeout performed Patient Re-evaluated:Patient Re-evaluated prior to inductionOxygen Delivery Method: Circle system utilized Preoxygenation: Pre-oxygenation with 100% oxygen Intubation Type: IV induction Ventilation: Mask ventilation without difficulty and Oral airway inserted - appropriate to patient size Laryngoscope Size: Mac and 4 Grade View: Grade III Tube type: Oral Tube size: 7.5 mm Number of attempts: 1 Airway Equipment and Method: Stylet and LTA kit utilized Placement Confirmation: positive ETCO2 and breath sounds checked- equal and bilateral Secured at: 22 cm Tube secured with: Tape Dental Injury: Teeth and Oropharynx as per pre-operative assessment

## 2012-01-21 MED ORDER — CYCLOBENZAPRINE HCL 10 MG PO TABS: 10.0000 mg | ORAL_TABLET | Freq: Three times a day (TID) | ORAL | Status: AC | PRN

## 2012-01-21 NOTE — Discharge Instructions (Signed)
Wound Care Keep incision covered and dry for one week.  If you shower prior to then, cover incision with plastic wrap.  You may remove outer bandage after one week and shower.  Do not put any creams, lotions, or ointments on incision. Leave steri-strips on neck.  They will fall off by themselves. Activity Walk each and every day, increasing distance each day. No lifting greater than 5 lbs.  Avoid excessive neck motion. No driving for 2 weeks; may ride as a passenger locally. Wear neck brace at all times except when showering or otherwise instructed. Diet Resume your normal diet.  Return to Work Will be discussed at you follow up appointment. Call Your Doctor If Any of These Occur Redness, drainage, or swelling at the wound.  Temperature greater than 101 degrees. Severe pain not relieved by pain medication. Increased difficulty swallowing.  Incision starts to come apart. Follow Up Appt Call today for appointment in 1-2 weeks ([phone]) or for problems.  If you have any hardware placed in your spine, you will need an x-ray before your appointment.   

## 2012-01-21 NOTE — Discharge Summary (Signed)
Physician Discharge Summary  Patient ID: Lynn Richardson MRN: [id number] DOB/AGE: [birth year] 51 y.o.  Admit date: 01/20/2012 Discharge date: 01/21/2012  Admission Diagnoses:Right C7-T1 herniated nucleus pulposus with right C8 radiculopathy niated nucleus pulposus with right C8 radiculopathy  Discharge Diagnoses: Right C7-T1 herniated nucleus pulposus with right C8 radiculopathy  Active Problems:  * No active hospital problems. *    Discharged Condition: good  Hospital Course: pt admitted day of surgery - underwent procedure below - pt with less arm sx's  - eating, voiding, ambulating -   Consults: None  Significant Diagnostic Studies: none  Treatments: surgery: Right C7-T1 foraminotomy and microdiscectomy utilizing microdissection to decompress the C8 nerve root   Discharge Exam: Blood pressure 111/65, pulse 81, temperature 97.9 F (36.6 C), temperature source Oral, resp. rate 18, SpO2 93.00%. Wound:c/d/i  Disposition: home   Medication List  As of 01/21/2012  7:36 AM   TAKE these medications         citalopram 20 MG tablet   Commonly known as: CELEXA   Take 20 mg by mouth daily.      cyclobenzaprine 10 MG tablet   Commonly known as: FLEXERIL   Take 1 tablet (10 mg total) by mouth 3 (three) times daily as needed for muscle spasms.      diazepam 10 MG tablet   Commonly known as: VALIUM   Take 10 mg by mouth as needed.      estradiol 1 MG tablet   Commonly known as: ESTRACE   Take 1 mg by mouth daily.      oxyCODONE-acetaminophen 5-325 MG per tablet   Commonly known as: PERCOCET   Take 1 tablet by mouth every 4 (four) hours as needed.             Signed: Clydene Fake, MD 01/21/2012, 7:36 AM

## 2012-01-23 ENCOUNTER — Encounter (HOSPITAL_COMMUNITY): Payer: Self-pay | Admitting: Neurological Surgery

## 2012-12-23 IMAGING — CR DG CERVICAL SPINE 1V
1 series · 1 of 1 positions shown · non-contrast
Comparison: MRI 01/05/2029 and  cervical spine 05/20/2008

CLINICAL DATA: Right C7-T1 foraminotomy and microdiskectomy.

DG CERVICAL SPINE - 1 VIEW

[view not recorded]
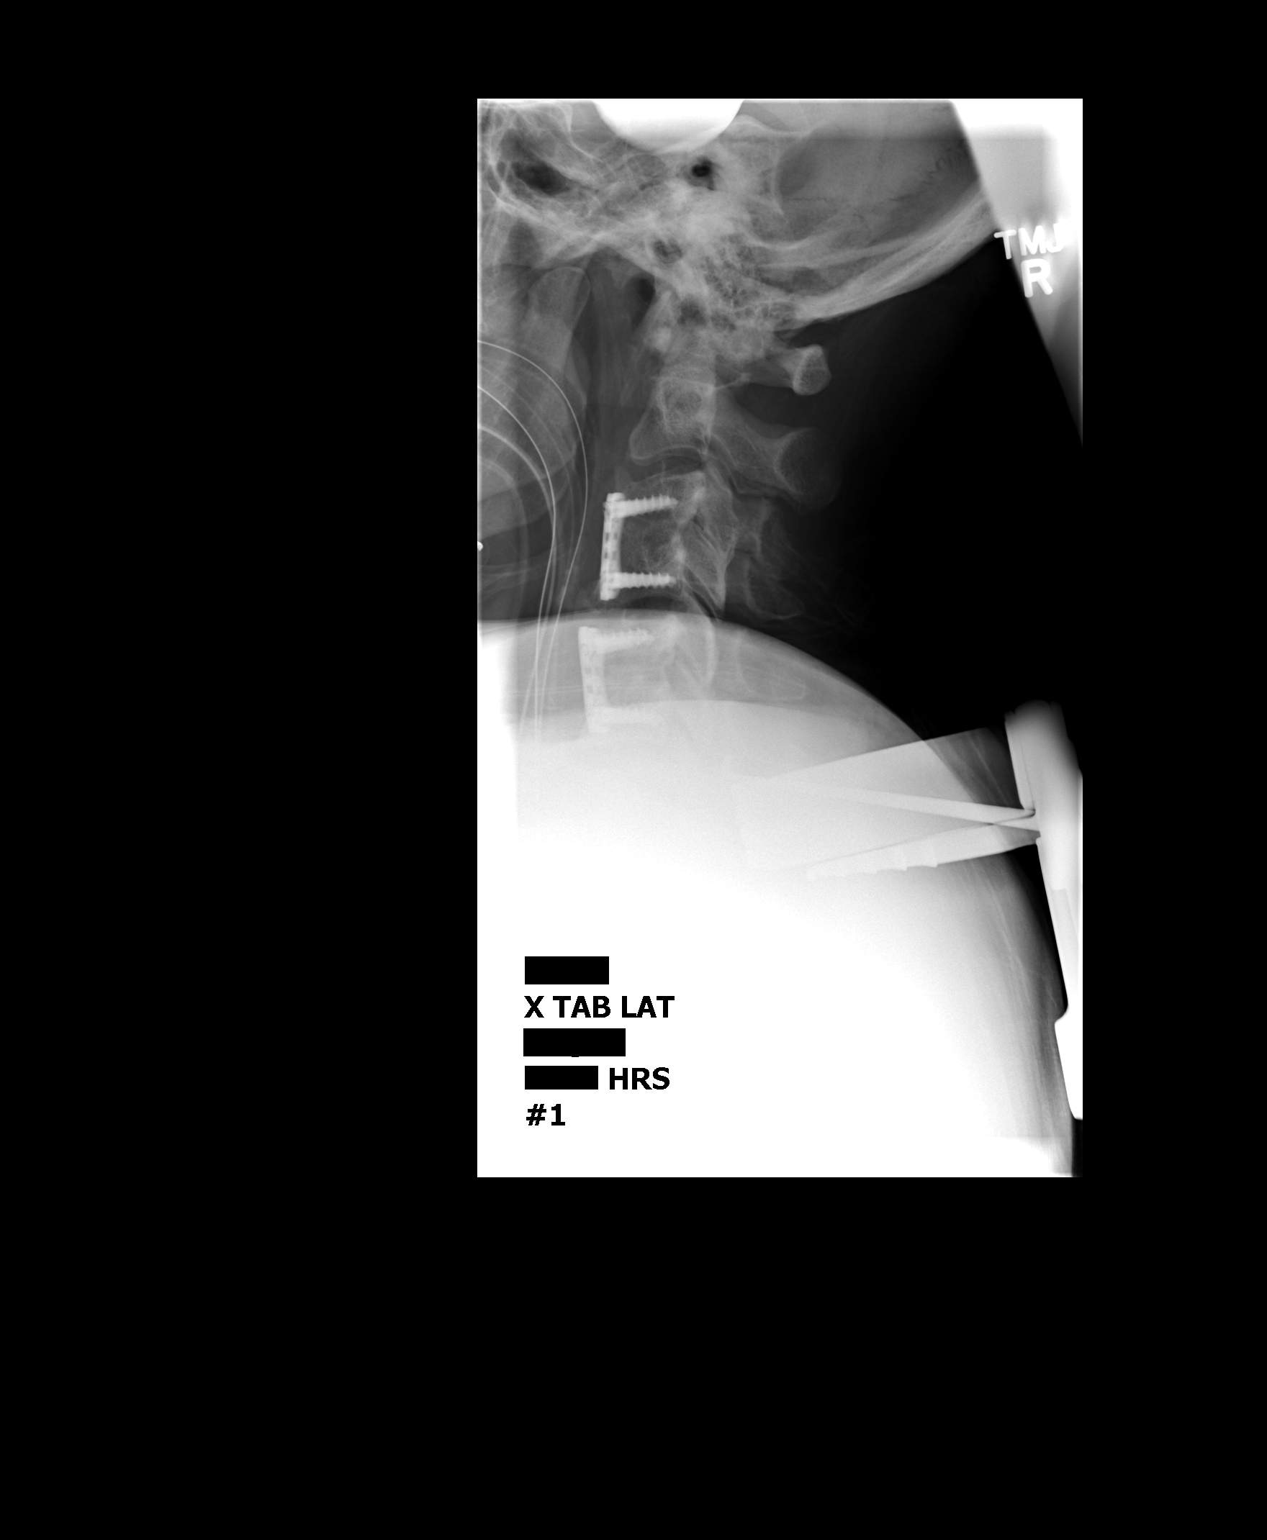

[1 of 1 positions shown; findings below may reference images not displayed]

FINDINGS: Single lateral view of the neck was obtained. Evidence
for an endotracheal tube and nasogastric tube.  There may be an
additional tube in this area.  Again noted is anterior plate and
screw fixation of C3-C4 and C5-C6.  C7-T1 area is poorly evaluated
due to the patient's shoulders.  There are surgical instruments
along the posterior aspect of C7.  Surgical marker appears to be
near C7.
IMPRESSION: Surgical marking in the C7 region.

## 2013-03-06 ENCOUNTER — Other Ambulatory Visit: Payer: Self-pay | Admitting: Neurological Surgery

## 2013-03-06 DIAGNOSIS — M542 Cervicalgia: Secondary | ICD-10-CM

## 2013-03-06 DIAGNOSIS — M549 Dorsalgia, unspecified: Secondary | ICD-10-CM

## 2013-03-14 ENCOUNTER — Ambulatory Visit
Admission: RE | Admit: 2013-03-14 | Discharge: 2013-03-14 | Disposition: A | Discharge status: Home / Self Care | Payer: BC Managed Care – PPO | Source: Ambulatory Visit | Attending: Neurological Surgery | Admitting: Neurological Surgery

## 2013-03-14 VITALS — BP 107/70 | HR 74

## 2013-03-14 DIAGNOSIS — M542 Cervicalgia: Secondary | ICD-10-CM

## 2013-03-14 DIAGNOSIS — M549 Dorsalgia, unspecified: Secondary | ICD-10-CM

## 2013-03-14 MED ORDER — HYDROMORPHONE HCL PF 2 MG/ML IJ SOLN
2.0000 mg | Freq: Once | INTRAMUSCULAR | Status: AC
  Administered 2013-03-14: 2 mg via INTRAMUSCULAR

## 2013-03-14 MED ORDER — IOHEXOL 300 MG/ML  SOLN
10.0000 mL | Freq: Once | INTRAMUSCULAR | Status: AC | PRN
  Administered 2013-03-14: 10 mL via INTRATHECAL

## 2013-03-14 MED ORDER — DIAZEPAM 5 MG PO TABS
10.0000 mg | ORAL_TABLET | Freq: Once | ORAL | Status: AC
  Administered 2013-03-14: 10 mg via ORAL

## 2013-03-14 MED ORDER — ONDANSETRON HCL 4 MG/2ML IJ SOLN
4.0000 mg | Freq: Once | INTRAMUSCULAR | Status: AC
  Administered 2013-03-14: 4 mg via INTRAMUSCULAR

## 2013-03-25 ENCOUNTER — Other Ambulatory Visit: Payer: Self-pay | Admitting: Neurological Surgery

## 2013-03-26 ENCOUNTER — Encounter (HOSPITAL_COMMUNITY): Payer: Self-pay | Admitting: Pharmacy Technician

## 2013-04-01 ENCOUNTER — Ambulatory Visit (HOSPITAL_COMMUNITY)
Admission: RE | Admit: 2013-04-01 | Discharge: 2013-04-01 | Disposition: A | Discharge status: Home / Self Care | Payer: BC Managed Care – PPO | Source: Ambulatory Visit | Attending: Neurological Surgery | Admitting: Neurological Surgery

## 2013-04-01 ENCOUNTER — Encounter (HOSPITAL_COMMUNITY): Payer: Self-pay

## 2013-04-01 ENCOUNTER — Encounter (HOSPITAL_COMMUNITY)
Admission: RE | Admit: 2013-04-01 | Discharge: 2013-04-01 | Disposition: A | Discharge status: Home / Self Care | Payer: BC Managed Care – PPO | Source: Ambulatory Visit | Attending: Neurological Surgery | Admitting: Neurological Surgery

## 2013-04-01 DIAGNOSIS — Z01812 Encounter for preprocedural laboratory examination: Secondary | ICD-10-CM | POA: Insufficient documentation

## 2013-04-01 DIAGNOSIS — Z01818 Encounter for other preprocedural examination: Secondary | ICD-10-CM | POA: Insufficient documentation

## 2013-04-01 HISTORY — DX: Headache: R51

## 2013-04-01 HISTORY — DX: Pain in unspecified joint: M25.50

## 2013-04-01 HISTORY — DX: Personal history of colon polyps, unspecified: Z86.0100

## 2013-04-01 HISTORY — DX: Personal history of colonic polyps: Z86.010

## 2013-04-01 HISTORY — DX: Weakness: R53.1

## 2013-04-01 LAB — BASIC METABOLIC PANEL
Chloride: 104 mEq/L (ref 96–112)
GFR calc Af Amer: 80 mL/min — ABNORMAL LOW (ref >90)
GFR calc non Af Amer: 69 mL/min — ABNORMAL LOW (ref >90)
Potassium: 3.5 mEq/L (ref 3.5–5.1)
Sodium: 141 mEq/L (ref 135–145)

## 2013-04-01 LAB — CBC WITH DIFFERENTIAL/PLATELET
Basophils Relative: 0 % (ref 0–1)
HCT: 38 % (ref 36.0–46.0)
Hemoglobin: 12.8 g/dL (ref 12.0–15.0)
Lymphocytes Relative: 38 % (ref 12–46)
Monocytes Absolute: 0.5 10*3/uL (ref 0.1–1.0)
Monocytes Relative: 5 % (ref 3–12)
Neutro Abs: 5.4 10*3/uL (ref 1.7–7.7)
Neutrophils Relative %: 55 % (ref 43–77)
RBC: 4.57 MIL/uL (ref 3.87–5.11)
WBC: 9.9 10*3/uL (ref 4.0–10.5)

## 2013-04-01 LAB — SURGICAL PCR SCREEN
MRSA, PCR: NEGATIVE
Staphylococcus aureus: NEGATIVE

## 2013-04-01 NOTE — Pre-Procedure Instructions (Signed)
Lynn Richardson  04/01/2013   Your procedure is scheduled on:  Thurs, July 24 @ 1:35 PM  Report to [facility] at 11:30 AM.  Call this number if you have problems the morning of surgery: [phone]   Remember:   Do not eat food or drink liquids after midnight.   Take these medicines the morning of surgery with A SIP OF WATER: Valium(Diazepam) and Pain Pill(if needed)   Do not wear jewelry, make-up or nail polish.  Do not wear lotions, powders, or perfumes. You may wear deodorant.  Do not shave 48 hours prior to surgery.   Do not bring valuables to the hospital.  [facility] is not responsible                   for any belongings or valuables.  Contacts, dentures or bridgework may not be worn into surgery.  Leave suitcase in the car. After surgery it may be brought to your room.  For patients admitted to the hospital, checkout time is 11:00 AM the day of  discharge.   Patients discharged the day of surgery will not be allowed to drive  home.    Special Instructions: Shower using CHG 2 nights before surgery and the night before surgery.  If you shower the day of surgery use CHG.  Use special wash - you have one bottle of CHG for all showers.  You should use approximately 1/3 of the bottle for each shower.   Please read over the following fact sheets that you were given: Pain Booklet, Coughing and Deep Breathing, MRSA Information and Surgical Site Infection Prevention

## 2013-04-01 NOTE — Progress Notes (Signed)
pts doesn't have a cardiologist  Denies ever having a stress test/echo/heart cath  Dr.James Earlene Plater is medical md  Denies ekg or cxr in epic

## 2013-04-03 MED ORDER — DEXAMETHASONE SODIUM PHOSPHATE 10 MG/ML IJ SOLN: 10.0000 mg | INTRAMUSCULAR | Status: DC

## 2013-04-03 MED ORDER — DEXTROSE 5 % IV SOLN: 2.0000 g | INTRAVENOUS | Status: DC

## 2013-04-04 ENCOUNTER — Encounter (HOSPITAL_COMMUNITY): Payer: Self-pay | Admitting: Anesthesiology

## 2013-04-04 ENCOUNTER — Encounter (HOSPITAL_COMMUNITY)
Admission: RE | Disposition: A | Discharge status: Home / Self Care | Payer: Self-pay | Source: Ambulatory Visit | Attending: Neurological Surgery

## 2013-04-04 ENCOUNTER — Encounter (HOSPITAL_COMMUNITY): Payer: Self-pay | Admitting: *Deleted

## 2013-04-04 ENCOUNTER — Ambulatory Visit (HOSPITAL_COMMUNITY)
Admission: RE | Admit: 2013-04-04 | Discharge: 2013-04-08 | Disposition: A | Discharge status: Home / Self Care | Payer: BC Managed Care – PPO | Source: Ambulatory Visit | Attending: Neurological Surgery | Admitting: Neurological Surgery

## 2013-04-04 ENCOUNTER — Ambulatory Visit (HOSPITAL_COMMUNITY): Payer: BC Managed Care – PPO

## 2013-04-04 ENCOUNTER — Ambulatory Visit (HOSPITAL_COMMUNITY): Payer: BC Managed Care – PPO | Admitting: Anesthesiology

## 2013-04-04 DIAGNOSIS — M47812 Spondylosis without myelopathy or radiculopathy, cervical region: Secondary | ICD-10-CM | POA: Insufficient documentation

## 2013-04-04 DIAGNOSIS — Z981 Arthrodesis status: Secondary | ICD-10-CM

## 2013-04-04 HISTORY — PX: ANTERIOR CERVICAL DECOMP/DISCECTOMY FUSION: SHX1161

## 2013-04-04 SURGERY — ANTERIOR CERVICAL DECOMPRESSION/DISCECTOMY FUSION 2 LEVEL/HARDWARE REMOVAL
Anesthesia: General | Site: Spine Cervical | Wound class: Clean

## 2013-04-04 MED ORDER — PHENOL 1.4 % MT LIQD: 1.0000 | OROMUCOSAL | Status: DC | PRN

## 2013-04-04 MED ORDER — DEXAMETHASONE SODIUM PHOSPHATE 4 MG/ML IJ SOLN
4.0000 mg | Freq: Four times a day (QID) | INTRAMUSCULAR | Status: DC
  Administered 2013-04-04 – 2013-04-08 (×7): 4 mg via INTRAVENOUS
  Filled 2013-04-04 (×19): qty 1

## 2013-04-04 MED ORDER — OXYCODONE HCL 5 MG PO TABS
ORAL_TABLET | ORAL | Status: AC
  Administered 2013-04-04: 5 mg via ORAL
  Filled 2013-04-04: qty 1

## 2013-04-04 MED ORDER — CEFAZOLIN SODIUM-DEXTROSE 2-3 GM-% IV SOLR
INTRAVENOUS | Status: AC
  Administered 2013-04-04: 2 g via INTRAVENOUS
  Filled 2013-04-04: qty 50

## 2013-04-04 MED ORDER — SODIUM CHLORIDE 0.9 % IV SOLN
INTRAVENOUS | Status: AC
  Filled 2013-04-04: qty 500

## 2013-04-04 MED ORDER — 0.9 % SODIUM CHLORIDE (POUR BTL) OPTIME
TOPICAL | Status: DC | PRN
  Administered 2013-04-04: 1000 mL

## 2013-04-04 MED ORDER — THROMBIN 5000 UNITS EX SOLR
OROMUCOSAL | Status: DC | PRN
  Administered 2013-04-04 (×2): via TOPICAL

## 2013-04-04 MED ORDER — ACETAMINOPHEN 10 MG/ML IV SOLN
INTRAVENOUS | Status: AC
  Administered 2013-04-04: 1000 mg via INTRAVENOUS
  Filled 2013-04-04: qty 100

## 2013-04-04 MED ORDER — ACETAMINOPHEN 650 MG RE SUPP: 650.0000 mg | RECTAL | Status: DC | PRN

## 2013-04-04 MED ORDER — PROPOFOL 10 MG/ML IV BOLUS
INTRAVENOUS | Status: DC | PRN
  Administered 2013-04-04: 190 mg via INTRAVENOUS

## 2013-04-04 MED ORDER — BUPIVACAINE HCL (PF) 0.25 % IJ SOLN
INTRAMUSCULAR | Status: DC | PRN
  Administered 2013-04-04: 3 mL

## 2013-04-04 MED ORDER — ACETAMINOPHEN 10 MG/ML IV SOLN
1000.0000 mg | Freq: Four times a day (QID) | INTRAVENOUS | Status: DC
  Administered 2013-04-04: 1000 mg via INTRAVENOUS
  Filled 2013-04-04 (×3): qty 100

## 2013-04-04 MED ORDER — LIDOCAINE HCL (CARDIAC) 20 MG/ML IV SOLN
INTRAVENOUS | Status: DC | PRN
  Administered 2013-04-04: 70 mg via INTRAVENOUS

## 2013-04-04 MED ORDER — ONDANSETRON HCL 4 MG/2ML IJ SOLN
INTRAMUSCULAR | Status: DC | PRN
  Administered 2013-04-04: 4 mg via INTRAVENOUS

## 2013-04-04 MED ORDER — MIDAZOLAM HCL 2 MG/2ML IJ SOLN: 0.5000 mg | Freq: Once | INTRAMUSCULAR | Status: DC | PRN

## 2013-04-04 MED ORDER — GLYCOPYRROLATE 0.2 MG/ML IJ SOLN
INTRAMUSCULAR | Status: DC | PRN
  Administered 2013-04-04: .8 mg via INTRAVENOUS

## 2013-04-04 MED ORDER — MIDAZOLAM HCL 5 MG/5ML IJ SOLN
INTRAMUSCULAR | Status: DC | PRN
  Administered 2013-04-04: 2 mg via INTRAVENOUS

## 2013-04-04 MED ORDER — HYDROMORPHONE HCL PF 1 MG/ML IJ SOLN
INTRAMUSCULAR | Status: AC
  Administered 2013-04-04: 0.5 mg via INTRAVENOUS
  Filled 2013-04-04: qty 1

## 2013-04-04 MED ORDER — ONDANSETRON HCL 4 MG/2ML IJ SOLN
4.0000 mg | INTRAMUSCULAR | Status: DC | PRN
  Administered 2013-04-05 – 2013-04-07 (×4): 4 mg via INTRAVENOUS
  Filled 2013-04-04 (×4): qty 2

## 2013-04-04 MED ORDER — DEXAMETHASONE SODIUM PHOSPHATE 10 MG/ML IJ SOLN
INTRAMUSCULAR | Status: AC
  Administered 2013-04-04: 10 mg via INTRAVENOUS
  Filled 2013-04-04: qty 1

## 2013-04-04 MED ORDER — SODIUM CHLORIDE 0.9 % IV SOLN: 250.0000 mL | INTRAVENOUS | Status: DC

## 2013-04-04 MED ORDER — ACETAMINOPHEN 325 MG PO TABS: 650.0000 mg | ORAL_TABLET | ORAL | Status: DC | PRN

## 2013-04-04 MED ORDER — METHOCARBAMOL 500 MG PO TABS
ORAL_TABLET | ORAL | Status: AC
  Administered 2013-04-04: 500 mg via ORAL
  Filled 2013-04-04: qty 1

## 2013-04-04 MED ORDER — OXYCODONE HCL 5 MG/5ML PO SOLN: 5.0000 mg | Freq: Once | ORAL | Status: AC | PRN

## 2013-04-04 MED ORDER — METHOCARBAMOL 500 MG PO TABS
500.0000 mg | ORAL_TABLET | Freq: Four times a day (QID) | ORAL | Status: DC | PRN
  Administered 2013-04-05: 500 mg via ORAL
  Filled 2013-04-04: qty 1

## 2013-04-04 MED ORDER — ESTRADIOL 1 MG PO TABS
1.0000 mg | ORAL_TABLET | Freq: Every day | ORAL | Status: DC
  Administered 2013-04-05 – 2013-04-08 (×3): 1 mg via ORAL
  Filled 2013-04-04 (×5): qty 1

## 2013-04-04 MED ORDER — METHOCARBAMOL 100 MG/ML IJ SOLN
500.0000 mg | Freq: Four times a day (QID) | INTRAMUSCULAR | Status: DC | PRN
  Filled 2013-04-04: qty 5

## 2013-04-04 MED ORDER — THROMBIN 5000 UNITS EX SOLR
CUTANEOUS | Status: DC | PRN
  Administered 2013-04-04 (×2): 5000 [IU] via TOPICAL

## 2013-04-04 MED ORDER — POTASSIUM CHLORIDE IN NACL 20-0.9 MEQ/L-% IV SOLN
INTRAVENOUS | Status: DC
  Administered 2013-04-04 – 2013-04-05 (×2): via INTRAVENOUS
  Filled 2013-04-04 (×8): qty 1000

## 2013-04-04 MED ORDER — MORPHINE SULFATE 2 MG/ML IJ SOLN
1.0000 mg | INTRAMUSCULAR | Status: DC | PRN
  Administered 2013-04-04 (×2): 2 mg via INTRAVENOUS
  Administered 2013-04-05 (×2): 4 mg via INTRAVENOUS
  Administered 2013-04-06 (×3): 2 mg via INTRAVENOUS
  Filled 2013-04-04: qty 1
  Filled 2013-04-04 (×2): qty 2
  Filled 2013-04-04 (×4): qty 1

## 2013-04-04 MED ORDER — SODIUM CHLORIDE 0.9 % IJ SOLN: 3.0000 mL | INTRAMUSCULAR | Status: DC | PRN

## 2013-04-04 MED ORDER — BACITRACIN 50000 UNITS IM SOLR
INTRAMUSCULAR | Status: AC
  Filled 2013-04-04: qty 1

## 2013-04-04 MED ORDER — SODIUM CHLORIDE 0.9 % IJ SOLN
3.0000 mL | Freq: Two times a day (BID) | INTRAMUSCULAR | Status: DC
  Administered 2013-04-05 – 2013-04-07 (×5): 3 mL via INTRAVENOUS

## 2013-04-04 MED ORDER — OXYCODONE-ACETAMINOPHEN 5-325 MG PO TABS
1.0000 | ORAL_TABLET | ORAL | Status: DC | PRN
  Administered 2013-04-05 (×2): 2 via ORAL
  Filled 2013-04-04 (×2): qty 2

## 2013-04-04 MED ORDER — CEFAZOLIN SODIUM 1-5 GM-% IV SOLN
1.0000 g | Freq: Three times a day (TID) | INTRAVENOUS | Status: AC
  Administered 2013-04-04 – 2013-04-05 (×2): 1 g via INTRAVENOUS
  Filled 2013-04-04 (×2): qty 50

## 2013-04-04 MED ORDER — BACITRACIN 50000 UNITS IM SOLR
INTRAMUSCULAR | Status: DC | PRN
  Administered 2013-04-04: 15:00:00

## 2013-04-04 MED ORDER — HYDROMORPHONE HCL PF 1 MG/ML IJ SOLN: 0.2500 mg | INTRAMUSCULAR | Status: DC | PRN

## 2013-04-04 MED ORDER — ROCURONIUM BROMIDE 100 MG/10ML IV SOLN
INTRAVENOUS | Status: DC | PRN
  Administered 2013-04-04: 50 mg via INTRAVENOUS

## 2013-04-04 MED ORDER — LACTATED RINGERS IV SOLN
INTRAVENOUS | Status: DC
  Administered 2013-04-04 (×2): via INTRAVENOUS

## 2013-04-04 MED ORDER — OXYCODONE HCL 5 MG PO TABS: 5.0000 mg | ORAL_TABLET | Freq: Once | ORAL | Status: AC | PRN

## 2013-04-04 MED ORDER — MEPERIDINE HCL 25 MG/ML IJ SOLN: 6.2500 mg | INTRAMUSCULAR | Status: DC | PRN

## 2013-04-04 MED ORDER — NEOSTIGMINE METHYLSULFATE 1 MG/ML IJ SOLN
INTRAMUSCULAR | Status: DC | PRN
  Administered 2013-04-04: 5 mg via INTRAVENOUS

## 2013-04-04 MED ORDER — PROMETHAZINE HCL 25 MG/ML IJ SOLN: 6.2500 mg | INTRAMUSCULAR | Status: DC | PRN

## 2013-04-04 MED ORDER — FENTANYL CITRATE 0.05 MG/ML IJ SOLN
INTRAMUSCULAR | Status: DC | PRN
  Administered 2013-04-04 (×7): 50 ug via INTRAVENOUS

## 2013-04-04 MED ORDER — HEMOSTATIC AGENTS (NO CHARGE) OPTIME
TOPICAL | Status: DC | PRN
  Administered 2013-04-04: 1 via TOPICAL

## 2013-04-04 MED ORDER — DEXAMETHASONE 4 MG PO TABS
4.0000 mg | ORAL_TABLET | Freq: Four times a day (QID) | ORAL | Status: DC
  Administered 2013-04-05 – 2013-04-08 (×8): 4 mg via ORAL
  Filled 2013-04-04 (×18): qty 1

## 2013-04-04 MED ORDER — MENTHOL 3 MG MT LOZG: 1.0000 | LOZENGE | OROMUCOSAL | Status: DC | PRN

## 2013-04-04 SURGICAL SUPPLY — 65 items
APL SKNCLS STERI-STRIP NONHPOA (GAUZE/BANDAGES/DRESSINGS) ×1
BAG DECANTER FOR FLEXI CONT (MISCELLANEOUS) ×2 IMPLANT
BENZOIN TINCTURE PRP APPL 2/3 (GAUZE/BANDAGES/DRESSINGS) ×2 IMPLANT
BONE MATRIX OSTEOCEL PLUS 1CC (Bone Implant) ×1 IMPLANT
BUR MATCHSTICK NEURO 3.0 LAGG (BURR) ×2 IMPLANT
CAGE COROENT SM 7X17X14-7SPINE (Cage) ×2 IMPLANT
CANISTER SUCTION 2500CC (MISCELLANEOUS) ×2 IMPLANT
CLOTH BEACON ORANGE TIMEOUT ST (SAFETY) ×2 IMPLANT
CONT SPEC 4OZ CLIKSEAL STRL BL (MISCELLANEOUS) ×2 IMPLANT
DRAPE C-ARM 42X72 X-RAY (DRAPES) ×4 IMPLANT
DRAPE LAPAROTOMY 100X72 PEDS (DRAPES) ×2 IMPLANT
DRAPE MICROSCOPE LEICA (MISCELLANEOUS) ×1 IMPLANT
DRAPE MICROSCOPE ZEISS OPMI (DRAPES) ×1 IMPLANT
DRAPE POUCH INSTRU U-SHP 10X18 (DRAPES) ×2 IMPLANT
DRESSING TELFA 8X3 (GAUZE/BANDAGES/DRESSINGS) ×2 IMPLANT
DRILL BIT HELIX (BIT) ×1 IMPLANT
DRSG OPSITE 4X5.5 SM (GAUZE/BANDAGES/DRESSINGS) ×1 IMPLANT
DURAPREP 6ML APPLICATOR 50/CS (WOUND CARE) ×2 IMPLANT
ELECT COATED BLADE 2.86 ST (ELECTRODE) ×2 IMPLANT
ELECT REM PT RETURN 9FT ADLT (ELECTROSURGICAL) ×2
ELECTRODE REM PT RTRN 9FT ADLT (ELECTROSURGICAL) ×1 IMPLANT
GAUZE SPONGE 4X4 16PLY XRAY LF (GAUZE/BANDAGES/DRESSINGS) IMPLANT
GLOVE BIO SURGEON STRL SZ7.5 (GLOVE) ×3 IMPLANT
GLOVE BIO SURGEON STRL SZ8 (GLOVE) ×3 IMPLANT
GLOVE BIO SURGEON STRL SZ8.5 (GLOVE) ×2 IMPLANT
GLOVE BIOGEL PI IND STRL 7.5 (GLOVE) IMPLANT
GLOVE BIOGEL PI IND STRL 8.5 (GLOVE) IMPLANT
GLOVE BIOGEL PI INDICATOR 7.5 (GLOVE) ×1
GLOVE BIOGEL PI INDICATOR 8.5 (GLOVE) ×1
GLOVE ECLIPSE 7.5 STRL STRAW (GLOVE) ×2 IMPLANT
GLOVE SS BIOGEL STRL SZ 8 (GLOVE) IMPLANT
GLOVE SUPERSENSE BIOGEL SZ 8 (GLOVE) ×2
GLOVE SURG SS PI 8.0 STRL IVOR (GLOVE) ×1 IMPLANT
GOWN BRE IMP SLV AUR LG STRL (GOWN DISPOSABLE) IMPLANT
GOWN BRE IMP SLV AUR XL STRL (GOWN DISPOSABLE) ×5 IMPLANT
GOWN STRL REIN 2XL LVL4 (GOWN DISPOSABLE) ×2 IMPLANT
HEAD HALTER (SOFTGOODS) IMPLANT
HEMOSTAT POWDER KIT SURGIFOAM (HEMOSTASIS) ×3 IMPLANT
KIT BASIN OR (CUSTOM PROCEDURE TRAY) ×2 IMPLANT
KIT ROOM TURNOVER OR (KITS) ×2 IMPLANT
NDL HYPO 25X1 1.5 SAFETY (NEEDLE) ×1 IMPLANT
NDL SPNL 20GX3.5 QUINCKE YW (NEEDLE) ×1 IMPLANT
NEEDLE HYPO 25X1 1.5 SAFETY (NEEDLE) ×2 IMPLANT
NEEDLE SPNL 20GX3.5 QUINCKE YW (NEEDLE) ×2 IMPLANT
NS IRRIG 1000ML POUR BTL (IV SOLUTION) ×2 IMPLANT
PACK LAMINECTOMY NEURO (CUSTOM PROCEDURE TRAY) ×2 IMPLANT
PAD ARMBOARD 7.5X6 YLW CONV (MISCELLANEOUS) ×4 IMPLANT
PIN DISTRACTION 14MM (PIN) ×2 IMPLANT
PLATE HELIX-R 24MM (Plate) ×1 IMPLANT
RUBBERBAND STERILE (MISCELLANEOUS) ×4 IMPLANT
SCREW 4.0X13 (Screw) IMPLANT
SCREW 4.0X13MM (Screw) ×2 IMPLANT
SCREW COROENTSELF-TAP 4X14MM (Screw) ×2 IMPLANT
SCREW HELIX 4.0X13 (Screw) IMPLANT
SCREW HELIX 4.0X13MM (Screw) ×4 IMPLANT
SCREW SI COROENT SELFTAP 4X12 (Screw) ×1 IMPLANT
SPONGE INTESTINAL PEANUT (DISPOSABLE) ×2 IMPLANT
STRIP CLOSURE SKIN 1/2X4 (GAUZE/BANDAGES/DRESSINGS) ×2 IMPLANT
SUT VIC AB 3-0 SH 8-18 (SUTURE) ×3 IMPLANT
SYR 20ML ECCENTRIC (SYRINGE) ×1 IMPLANT
TAPE STRIPS DRAPE STRL (GAUZE/BANDAGES/DRESSINGS) ×1 IMPLANT
TOWEL OR 17X24 6PK STRL BLUE (TOWEL DISPOSABLE) ×2 IMPLANT
TOWEL OR 17X26 10 PK STRL BLUE (TOWEL DISPOSABLE) ×2 IMPLANT
TRAP SPECIMEN MUCOUS 40CC (MISCELLANEOUS) ×1 IMPLANT
WATER STERILE IRR 1000ML POUR (IV SOLUTION) ×2 IMPLANT

## 2013-04-04 NOTE — Preoperative (Signed)
Beta Blockers   Reason not to administer Beta Blockers:Not Applicable 

## 2013-04-04 NOTE — Anesthesia Preprocedure Evaluation (Addendum)
Anesthesia Evaluation   Patient awake    Reviewed: Allergy & Precautions, H&P , NPO status , Patient's Chart, lab work & pertinent test results  History of Anesthesia Complications Negative for: history of anesthetic complications  Airway Mallampati: I  Neck ROM: Full    Dental  (+) Teeth Intact   Pulmonary neg pulmonary ROS,  breath sounds clear to auscultation        Cardiovascular negative cardio ROS  Rhythm:Regular Rate:Normal     Neuro/Psych  Headaches,    GI/Hepatic negative GI ROS, Neg liver ROS,   Endo/Other  negative endocrine ROS  Renal/GU negative Renal ROS     Musculoskeletal  (+) Arthritis -,   Abdominal   Peds  Hematology negative hematology ROS (+)   Anesthesia Other Findings   Reproductive/Obstetrics                          Anesthesia Physical Anesthesia Plan  ASA: I  Anesthesia Plan: General   Post-op Pain Management:    Induction: Intravenous  Airway Management Planned: Oral ETT  Additional Equipment:   Intra-op Plan:   Post-operative Plan: Extubation in OR  Informed Consent: I have reviewed the patients History and Physical, chart, labs and discussed the procedure including the risks, benefits and alternatives for the proposed anesthesia with the patient or authorized representative who has indicated his/her understanding and acceptance.   Dental advisory given  Plan Discussed with: CRNA and Surgeon  Anesthesia Plan Comments:         Anesthesia Quick Evaluation

## 2013-04-04 NOTE — H&P (Signed)
Subjective:   Patient is a 52 y.o. female admitted for ACDF. The patient first presented to me with complaints of neck pain. Onset of symptoms was several months ago. The pain is described as aching and occurs intermittently. The pain is rated severe, and is located at the base of the neck and radiates to the arms. The symptoms have been progressive. Symptoms are exacerbated by extending head backwards, and are relieved by none.  Previous work up includes CT of cervical spine, results: spinal stenosis.  Past Medical History  Diagnosis Date  . Arthritis   . Anxiety   . Headache(784.0)     last migraine at least 67yrs ago  . Weakness     bilateral legs  . Joint pain   . History of colon polyps   . Depression     takes Valium daily    Past Surgical History  Procedure Laterality Date  . Cervical spine surgery  2008 and 2010  . Abdominal hysterectomy  2002  . Knee arthroscopy  1980  . Tonsillectomy    . Appendectomy    . Posterior cervical laminectomy  01/20/2012    Procedure: POSTERIOR CERVICAL LAMINECTOMY;  Surgeon: Tia Alert, MD;  Location: [facility];  Service: Neurosurgery;  Laterality: Right;  Right Cervical Seven to Thoracic One Foraminotomy/ Disckectomy  . Colonoscopy      No Known Allergies  History  Substance Use Topics  . Smoking status: Former Games developer  . Smokeless tobacco: Not on file     Comment: quit 43yrs ago May 20  . Alcohol Use: No    History reviewed. No pertinent family history. Prior to Admission medications   Medication Sig Start Date End Date Taking? Authorizing Provider  diazepam (VALIUM) 10 MG tablet Take 10 mg by mouth daily as needed for anxiety or sleep.    Yes Historical Provider, MD  estradiol (ESTRACE) 1 MG tablet Take 1 mg by mouth daily.   Yes Historical Provider, MD  oxyCODONE-acetaminophen (PERCOCET) 5-325 MG per tablet Take 1 tablet by mouth every 4 (four) hours as needed for pain.    Yes Historical Provider, MD     Review of  Systems  Positive ROS: neg  All other systems have been reviewed and were otherwise negative with the exception of those mentioned in the HPI and as above.  Objective: Vital signs in last 24 hours: Temp:  [97 F (36.1 C)] 97 F (36.1 C) (07/24 1241) Pulse Rate:  [78] 78 (07/24 1241) Resp:  [18] 18 (07/24 1241) BP: (133)/(76) 133/76 mmHg (07/24 1241) SpO2:  [97 %] 97 % (07/24 1241)  General Appearance: Alert, cooperative, no distress, appears stated age Head: Normocephalic, without obvious abnormality, atraumatic Eyes: PERRL, conjunctiva/corneas clear, EOM's intact, fundi benign, both eyes      Ears: Normal TM's and external ear canals, both ears Throat: Lips, mucosa, and tongue normal; teeth and gums normal Neck: Supple, symmetrical, trachea midline, no adenopathy; thyroid: No enlargement/tenderness/nodules; no carotid bruit or JVD Back: Symmetric, no curvature, ROM normal, no CVA tenderness Lungs: Clear to auscultation bilaterally, respirations unlabored Heart: Regular rate and rhythm, S1 and S2 normal, no murmur, rub or gallop Abdomen: Soft, non-tender, bowel sounds active all four quadrants, no masses, no organomegaly Extremities: Extremities normal, atraumatic, no cyanosis or edema Pulses: 2+ and symmetric all extremities Skin: Skin color, texture, turgor normal, no rashes or lesions  NEUROLOGIC:  Mental status: Alert and oriented x4, no aphasia, good attention span, fund of knowledge and memory  Motor Exam - grossly normal Sensory Exam - grossly normal Reflexes: 1+ Coordination - grossly normal Gait - grossly normal Balance - grossly normal Cranial Nerves: I: smell Not tested  II: visual acuity  OS: nl    OD: nl  II: visual fields Full to confrontation  II: pupils Equal, round, reactive to light  III,VII: ptosis None  III,IV,VI: extraocular muscles  Full ROM  V: mastication Normal  V: facial light touch sensation  Normal  V,VII: corneal reflex  Present  VII:  facial muscle function - upper  Normal  VII: facial muscle function - lower Normal  VIII: hearing Not tested  IX: soft palate elevation  Normal  IX,X: gag reflex Present  XI: trapezius strength  5/5  XI: sternocleidomastoid strength 5/5  XI: neck flexion strength  5/5  XII: tongue strength  Normal    Data Review Lab Results  Component Value Date   WBC 9.9 04/01/2013   HGB 12.8 04/01/2013   HCT 38.0 04/01/2013   MCV 83.2 04/01/2013   PLT 188 04/01/2013   Lab Results  Component Value Date   NA 141 04/01/2013   K 3.5 04/01/2013   CL 104 04/01/2013   CO2 28 04/01/2013   BUN 5* 04/01/2013   CREATININE 0.93 04/01/2013   GLUCOSE 153* 04/01/2013   Lab Results  Component Value Date   INR 1.06 01/18/2012    Assessment:   Cervical neck pain with herniated nucleus pulposus/ spondylosis/ stenosis at C4-5, C6-7. Patient has failed conservative therapy. Planned surgery : ACDF C4-5, C6-7  Plan:   I explained the condition and procedure to the patient and answered any questions.  Patient wishes to proceed with procedure as planned. Understands risks/ benefits/ and expected or typical outcomes.  JONES,DAVID S 04/04/2013 2:11 PM

## 2013-04-04 NOTE — Transfer of Care (Signed)
Immediate Anesthesia Transfer of Care Note  Patient: Lynn Richardson  Procedure(s) Performed: Procedure(s) with comments: ANTERIOR CERVICAL DECOMPRESSION/DISCECTOMY FUSION 2 LEVEL/HARDWARE REMOVAL (N/A) - Cervical four-five,Cervical six-seven with hardware removal  Patient Location: PACU  Anesthesia Type:General  Level of Consciousness: awake and patient cooperative  Airway & Oxygen Therapy: Patient Spontanous Breathing and Patient connected to face mask oxygen  Post-op Assessment: Report given to PACU RN, Post -op Vital signs reviewed and stable and Patient moving all extremities X 4  Post vital signs: Reviewed and stable  Complications: No apparent anesthesia complications

## 2013-04-04 NOTE — Op Note (Signed)
04/04/2013  5:52 PM  PATIENT:  Lynn Richardson  52 y.o. 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 Decompressive anterior cervical discectomy C4-5 and C6-7, 2. Removal of anterior cervical hardware C5-6, 3. Anterior cervical arthrodesis C4-5 and C6-7 utilizing peek interbody cages packed with local autograft and morcellized allograft, 4. Anterior cervical plating C6-7 utilizing the helix plate  SURGEON:  Marikay Alar, MD  ASSISTANTS: Dr. Lovell Sheehan  ANESTHESIA:   General  EBL: 200 ml  Total I/O In: 1000 [I.V.:1000] Out: 200 [Blood:200]  BLOOD ADMINISTERED:none  DRAINS: None   SPECIMEN:  No Specimen  INDICATION FOR PROCEDURE: This patient presented with neck pain with bilateral arm pain. She had previous ACDF at C3-4 and C56. He can myelogram showed adjacent level stenosis at C4-5 and C6-7 with cord compression. She tried medical management without relief. Recommended ACDF with plating at C4-5 and C6-7. Patient understood the risks, benefits, and alternatives and potential outcomes and wished to proceed.  PROCEDURE DETAILS: Patient was brought to the operating room placed under general endotracheal anesthesia. Patient was placed in the supine position on the operating room table. The neck was prepped with Duraprep and draped in a sterile fashion.   Three cc of local anesthesia was injected and a transverse incision was made on the right side of the neck.  Dissection was carried down thru the subcutaneous tissue and the platysma was  elevated, opened, and undermined with Metzenbaum scissors.  Dissection was then carried out thru an avascular plane leaving the sternocleidomastoid carotid artery and jugular vein laterally and the trachea and esophagus medially. The ventral aspect of the vertebral column was identified as well  as the C5-6 plate and a localizing x-ray was taken. The C6-7 level was identified. Blunt dissection was used to remove the soft tissues from the anterior cervical plate. Bone had grown up around the plate and this was removed with a Leksell rongeur. The screws were removed and then the plate was removed. The longus colli muscles were then elevated and the retractor was placed to expose C4-5 and C6-7. Large anterior osteophytes were removed from the inferior lip of C4 and inferiorly to C6 and The annulus was incised and the disc space entered. Discectomy was performed with micro-curettes and pituitary rongeurs. I then used the high-speed drill to drill the endplates down to the level of the posterior longitudinal ligament. The drill shavings were saved in a mucous trap for later arthrodesis. The operating microscope was draped and brought into the field provided additional magnification, illumination and visualization. Discectomy was continued posteriorly thru the disc space. Posterior longitudinal ligament was opened with a nerve hook, and then removed along with disc herniation and osteophytes, decompressing the spinal canal and thecal sac at both levels. We then continued to remove osteophytic overgrowth and disc material decompressing the neural foramina and exiting nerve roots bilaterally. The scope was angled up and down to help decompress and undercut the vertebral bodies. Once the decompression was completed we could pass a nerve hook circumferentially to assure adequate decompression in the midline and in the neural foramina at both levels. So by both visualization and palpation we felt we had an adequate decompression of the neural elements. We then measured the height of the intravertebral disc space and selected a 8 millimeter Peek interbody cage packed with autograft and morcellized allograft. It was  then gently positioned in the intravertebral disc space and countersunk at C6-7. I then used a helix  plate and placed four variable angle screws into the vertebral bodies of C6 and C7 and locked them into position. At C4-5 we placed a 0 profile 7 mm peek graft  With 12 mm and 13 mm screws that were placed through the graft into the vertebral bodies above and below. This was done utilizing lateral fluoroscopy. The wound was irrigated with bacitracin solution, checked for hemostasis which was established and confirmed. Once meticulous hemostasis was achieved, we then proceeded with closure. The platysma was closed with interrupted 3-0 undyed Vicryl suture, the subcuticular layer was closed with interrupted 3-0 undyed Vicryl suture. The skin edges were approximated with steristrips. The drapes were removed. A sterile dressing was applied. The patient was then awakened from general anesthesia and transferred to the recovery room in stable condition. At the end of the procedure all sponge, needle and instrument counts were correct.   PLAN OF CARE: Admit for overnight observation  PATIENT DISPOSITION:  PACU - hemodynamically stable.   Delay start of Pharmacological VTE agent (>24hrs) due to surgical blood loss or risk of bleeding:  yes

## 2013-04-04 NOTE — Anesthesia Procedure Notes (Addendum)
Procedure Name: Intubation Date/Time: 04/04/2013 2:29 PM Performed by: Sharlene Dory E Pre-anesthesia Checklist: Patient identified, Emergency Drugs available, Suction available, Patient being monitored and Timeout performed Patient Re-evaluated:Patient Re-evaluated prior to inductionOxygen Delivery Method: Circle system utilized Preoxygenation: Pre-oxygenation with 100% oxygen Intubation Type: IV induction Grade View: Grade II Tube type: Oral Tube size: 7.0 mm Number of attempts: 1 Airway Equipment and Method: Stylet and Video-laryngoscopy Placement Confirmation: ETT inserted through vocal cords under direct vision and positive ETCO2 Secured at: 21 cm Tube secured with: Tape Dental Injury: Teeth and Oropharynx as per pre-operative assessment  Comments: Electively used glidescope d/t large tongue.

## 2013-04-04 NOTE — Anesthesia Postprocedure Evaluation (Signed)
  Anesthesia Post-op Note  Patient: Lynn Richardson  Procedure(s) Performed: Procedure(s) with comments: ANTERIOR CERVICAL DECOMPRESSION/DISCECTOMY FUSION 2 LEVEL/HARDWARE REMOVAL (N/A) - Cervical four-five,Cervical six-seven with hardware removal  Patient Location: PACU  Anesthesia Type:General  Level of Consciousness: sedated, patient cooperative and responds to stimulation and voice  Airway and Oxygen Therapy: Patient Spontanous Breathing and Patient connected to nasal cannula oxygen  Post-op Pain: none  Post-op Assessment: Post-op Vital signs reviewed, Patient's Cardiovascular Status Stable, Respiratory Function Stable, Patent Airway, No signs of Nausea or vomiting and Pain level controlled  Post-op Vital Signs: Reviewed and stable  Complications: No apparent anesthesia complications

## 2013-04-05 MED ORDER — DIAZEPAM 5 MG PO TABS
5.0000 mg | ORAL_TABLET | Freq: Four times a day (QID) | ORAL | Status: DC | PRN
  Administered 2013-04-06: 5 mg via ORAL
  Filled 2013-04-05: qty 1

## 2013-04-05 MED ORDER — DOCUSATE SODIUM 100 MG PO CAPS
100.0000 mg | ORAL_CAPSULE | Freq: Every day | ORAL | Status: DC | PRN
  Administered 2013-04-06 – 2013-04-07 (×3): 100 mg via ORAL
  Filled 2013-04-05 (×3): qty 1

## 2013-04-05 MED ORDER — KETOROLAC TROMETHAMINE 30 MG/ML IJ SOLN
30.0000 mg | Freq: Four times a day (QID) | INTRAMUSCULAR | Status: DC
  Administered 2013-04-05 – 2013-04-07 (×9): 30 mg via INTRAVENOUS
  Filled 2013-04-05 (×13): qty 1

## 2013-04-05 MED ORDER — OXYCODONE HCL 5 MG PO TABS
10.0000 mg | ORAL_TABLET | ORAL | Status: DC | PRN
Start: 2013-04-05 — End: 2013-04-08
  Administered 2013-04-05: 10 mg via ORAL
  Filled 2013-04-05: qty 2

## 2013-04-05 MED ORDER — SODIUM CHLORIDE 0.9 % IV SOLN: INTRAVENOUS | Status: DC

## 2013-04-05 MED ORDER — ACETAMINOPHEN 10 MG/ML IV SOLN
1000.0000 mg | Freq: Four times a day (QID) | INTRAVENOUS | Status: DC
  Filled 2013-04-05 (×2): qty 100

## 2013-04-05 MED ORDER — ACETAMINOPHEN 500 MG PO TABS
1000.0000 mg | ORAL_TABLET | Freq: Four times a day (QID) | ORAL | Status: AC
  Administered 2013-04-05 – 2013-04-06 (×2): 1000 mg via ORAL
  Filled 2013-04-05 (×4): qty 2

## 2013-04-05 NOTE — Progress Notes (Signed)
Patient ID: Lynn Richardson, female   DOB: 14-Oct-1960, 52 y.o.   MRN: [id number] Pt very tearful and complaining of neck pain and throat soreness. Swallows water fine, MAE with good strength. Incision cdi. Will alter pain regimen. Mobilize (has only been up to the BR).

## 2013-04-05 NOTE — Progress Notes (Signed)
SCHEDULED IV ACETAMINOPHEN:  CONVERSION TO ORAL ROUTE to complete the ordered doses.  The Pharmacy and Therapeutics Committee has restricted administration of IV acetaminophen (with a 24 hr maximum duration) to patients who meet both of the following criteria:  Unable to tolerate oral or enteral medication  Contraindication to NSAIDs  Because the patient has taken other oral medications today, IV acetaminophen has been converted to PO to complete the course of therapy originally ordered.  If PO acetaminophen should be continued beyond the original stop time, please adjust the order accordingly using the "modify" function.   If you have questions about this conversion, please contact the pharmacy department.  Lynn Richardson, [facility] 04/05/2013 10:11 PM

## 2013-04-05 NOTE — Progress Notes (Signed)
Patient ambulated half of the unit hallway. Steady gait, minimal pain expressed.

## 2013-04-05 NOTE — Progress Notes (Signed)
Patient requesting stool softener. Dr. Ernst Breach. Order placed for colace daily PRN.

## 2013-04-06 MED ORDER — MAGNESIUM HYDROXIDE 400 MG/5ML PO SUSP
30.0000 mL | Freq: Every day | ORAL | Status: DC | PRN
  Administered 2013-04-06: 30 mL via ORAL
  Filled 2013-04-06: qty 30

## 2013-04-06 MED ORDER — FLEET ENEMA 7-19 GM/118ML RE ENEM
1.0000 | ENEMA | Freq: Every day | RECTAL | Status: DC | PRN
  Administered 2013-04-06: 07:00:00 via RECTAL
  Filled 2013-04-06: qty 1

## 2013-04-06 MED ORDER — SENNA 8.6 MG PO TABS: 1.0000 | ORAL_TABLET | Freq: Every day | ORAL | Status: DC | PRN

## 2013-04-06 MED ORDER — POLYETHYLENE GLYCOL 3350 17 G PO PACK
17.0000 g | PACK | Freq: Every day | ORAL | Status: DC
  Administered 2013-04-07: 17 g via ORAL
  Filled 2013-04-06 (×3): qty 1

## 2013-04-06 MED ORDER — HYDROMORPHONE HCL PF 1 MG/ML IJ SOLN
1.0000 mg | INTRAMUSCULAR | Status: DC | PRN
  Administered 2013-04-06 – 2013-04-08 (×8): 1 mg via INTRAVENOUS
  Filled 2013-04-06 (×8): qty 1

## 2013-04-06 MED ORDER — HYDROMORPHONE HCL 2 MG PO TABS
2.0000 mg | ORAL_TABLET | ORAL | Status: DC | PRN
  Administered 2013-04-07 – 2013-04-08 (×4): 2 mg via ORAL
  Filled 2013-04-06 (×5): qty 1

## 2013-04-06 MED ORDER — MORPHINE SULFATE 2 MG/ML IJ SOLN: 2.0000 mg | INTRAMUSCULAR | Status: DC | PRN

## 2013-04-06 MED ORDER — FAMOTIDINE 20 MG PO TABS
20.0000 mg | ORAL_TABLET | Freq: Two times a day (BID) | ORAL | Status: DC
  Administered 2013-04-06 – 2013-04-08 (×5): 20 mg via ORAL
  Filled 2013-04-06 (×6): qty 1

## 2013-04-06 NOTE — Progress Notes (Signed)
Patient was very nauseous early morning and did throw up twice. Zofran prn given. But continues to be nauseous Pt said she didn't had bowel movement for 5 days and that's why she is nauseated. Pt requested for enema which she takes at home prn. Notified Dr. Danielle Dess and received new orders for enema and milk of Mag.  K / RN, SCRN

## 2013-04-06 NOTE — Progress Notes (Signed)
Patient ID: Lynn Richardson, female   DOB: Oct 26, 1960, 52 y.o.   MRN: [id number] BP 132/52  Pulse 56  Temp(Src) 97.6 F (36.4 C) (Oral)  Resp 20  Ht 5\' 2"  (1.575 m)  Wt 91.173 kg (201 lb)  BMI 36.75 kg/m2  SpO2 97% Alert and oriented x 4, speech is clear, and fluent Moving all extremities well Wound is clean dry and without signs of infection Will give iv dilaudid

## 2013-04-06 NOTE — Progress Notes (Signed)
Patient ambulated around entire unit. Steady gait. Patient bed alarm d/c and patient able to ambulate without assistance at this time.

## 2013-04-07 NOTE — Progress Notes (Signed)
Subjective: Patient reports Very somnolent today  Objective: Vital signs in last 24 hours: Temp:  [97.5 F (36.4 C)-98.1 F (36.7 C)] 98 F (36.7 C) (07/27 1000) Pulse Rate:  [43-76] 76 (07/27 1000) Resp:  [18-20] 18 (07/27 1000) BP: (129-156)/(56-98) 146/56 mmHg (07/27 1000) SpO2:  [94 %-98 %] 98 % (07/27 1000)  Intake/Output from previous day: 07/26 0701 - 07/27 0700 In: 960 [P.O.:960] Out: -  Intake/Output this shift:    Incisions clean and dry motor function is intact but difficult to keep focused  Lab Results: No results found for this basename: WBC, HGB, HCT, PLT,  in the last 72 hours BMET No results found for this basename: NA, K, CL, CO2, GLUCOSE, BUN, CREATININE, CALCIUM,  in the last 72 hours  Studies/Results: No results found.  Assessment/Plan: Space medication out to decrease somnolent.  LOS: 3 days  Likely discharge tomorrow   Stefani Dama 04/07/2013, 12:40 PM

## 2013-04-08 ENCOUNTER — Encounter (HOSPITAL_COMMUNITY): Payer: Self-pay | Admitting: Neurological Surgery

## 2013-04-08 MED ORDER — HYDROMORPHONE HCL 2 MG PO TABS: 2.0000 mg | ORAL_TABLET | ORAL | Status: DC | PRN

## 2013-04-08 NOTE — Discharge Summary (Signed)
Physician Discharge Summary  Patient ID: Lynn Richardson MRN: [id number] DOB/AGE: Aug 30, 1961 52 y.o.  Admit date: 04/04/2013 Discharge date: 04/08/2013  Admission Diagnoses: cervical stenosis    Discharge Diagnoses: same   Discharged Condition: good  Hospital Course: The patient was admitted on 04/04/2013 and taken to the operating room where the patient underwent ACDF. The patient tolerated the procedure well and was taken to the recovery room and then to the floor in stable condition. The hospital course was routine except that she was very slow to mobilize and she had some N/V. There were no complications. The wound remained clean dry and intact. Pt had appropriate neck soreness. No complaints of arm pain or new N/T/W. The patient remained afebrile with stable vital signs, and tolerated a regular diet. The patient continued to increase activities, and pain was well controlled with oral pain medications.   Consults: None  Significant Diagnostic Studies:  Results for orders placed during the hospital encounter of 04/01/13  SURGICAL PCR SCREEN      Result Value Range   MRSA, PCR NEGATIVE  NEGATIVE   Staphylococcus aureus NEGATIVE  NEGATIVE  BASIC METABOLIC PANEL      Result Value Range   Sodium 141  135 - 145 mEq/L   Potassium 3.5  3.5 - 5.1 mEq/L   Chloride 104  96 - 112 mEq/L   CO2 28  19 - 32 mEq/L   Glucose, Bld 153 (*) 70 - 99 mg/dL   BUN 5 (*) 6 - 23 mg/dL   Creatinine, Ser 4.09  0.50 - 1.10 mg/dL   Calcium 9.6  8.4 - 81.1 mg/dL   GFR calc non Af Amer 69 (*) >90 mL/min   GFR calc Af Amer 80 (*) >90 mL/min  CBC WITH DIFFERENTIAL      Result Value Range   WBC 9.9  4.0 - 10.5 K/uL   RBC 4.57  3.87 - 5.11 MIL/uL   Hemoglobin 12.8  12.0 - 15.0 g/dL   HCT 91.4  78.2 - 95.6 %   MCV 83.2  78.0 - 100.0 fL   MCH 28.0  26.0 - 34.0 pg   MCHC 33.7  30.0 - 36.0 g/dL   RDW 21.3  08.6 - 57.8 %   Platelets 188  150 - 400 K/uL   Neutrophils Relative % 55  43 - 77 %    Neutro Abs 5.4  1.7 - 7.7 K/uL   Lymphocytes Relative 38  12 - 46 %   Lymphs Abs 3.7  0.7 - 4.0 K/uL   Monocytes Relative 5  3 - 12 %   Monocytes Absolute 0.5  0.1 - 1.0 K/uL   Eosinophils Relative 2  0 - 5 %   Eosinophils Absolute 0.2  0.0 - 0.7 K/uL   Basophils Relative 0  0 - 1 %   Basophils Absolute 0.0  0.0 - 0.1 K/uL    Chest 2 View  04/01/2013   *RADIOLOGY REPORT*  Clinical Data: Preop.  CHEST - 2 VIEW  Comparison: CT chest and chest radiograph 04/27/2012.  Findings: Trachea is midline.  Heart size normal.  Lungs are clear. No pleural fluid.  IMPRESSION: No acute findings.   Original Report Authenticated By: Leanna Battles, M.D.   Dg Cervical Spine 2-3 Views  04/04/2013   *RADIOLOGY REPORT*  Clinical Data: ACDF C4-5 and C6-7  CERVICAL SPINE - 2-3 VIEW  Comparison: Cervical myelogram 03/14/2013.  Findings: The first image demonstrates removal of the C5-6  The anterior plate.  A screw is now present from the C4-5 disc level through the superior endplate of C5.  The second and third images demonstrate to screws from the anterior disc space of C4-5 angled superiorly into the inferior endplate of C4.  The C3-4 ACDF plate is stable.  A new plate is evident anteriorly at C6-7.  IMPRESSION:  1.  Status post ACDF at C4-5 and C6-7 without radiographic evidence for complication.   Original Report Authenticated By: Marin Roberts, M.D.   Ct Cervical Spine W Contrast  03/14/2013   *RADIOLOGY REPORT*  Clinical Data:  Diffuse neck pain extending into both upper extremities along the posterior aspect of the arm to the elbows. Low back pain extending into the posterior thighs bilaterally.  MYELOGRAM INJECTION  Technique:  Informed consent was obtained from the patient prior to the procedure, including potential complications of headache, allergy, infection and pain.  A timeout procedure was performed. With the patient prone, the lower back was prepped with Betadine. 1% Lidocaine was used for local  anesthesia.  Lumbar puncture was performed at the right paramidline L2-3 level using a 22 low gauge needle with return of clear CSF.  10 ml of Omnipaque 300was injected into the subarachnoid space .  IMPRESSION: Successful injection of  intrathecal contrast for myelography.  MYELOGRAM CERVICAL AND LUMBAR  Technique: Radiologist injection Following injection of intrathecal Omnipaque contrast, spine imaging in multiple projections was performed using fluoroscopy.  Fluoroscopy Time: 2 minutes.  14 seconds.  Comparison:  MRI of the cervical spine 01/06/2012.  Findings: The patient is status post ACDF at C3-4 and C5-6.  The cervical contrast is somewhat faint.  There is some blunting of the nerve roots bilaterally at C4-5 and C6-7.  Please see the CT examination for further detail.  The lumbar images demonstrate mild disc bulging at L3-4, L4-5, and L5-S1.  There is chronic loss of height at L5-S1.  There is no definite stenosis.  The nerve roots fill normally on both sides.  The upright images demonstrate no significant change in the disc bulging.  Alignment is maintained.  Atherosclerotic calcifications are present within the aorta without aneurysm.  IMPRESSION:  1.  Status post ACDF at to C3-4 and C5-6. 2.  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 3.  Mild disc bulging at L3-4, L4-5, and L5-S1 without definite stenosis. 4.  Atherosclerosis.  *RADIOLOGY REPORT*  CT MYELOGRAPHY CERVICAL SPINE  Technique:  CT imaging of the cervical spine was performed after intrathecal contrast administration. Multiplanar CT image reconstructions were also generated.  Findings:  The cervical spine is imaged from skull base through T1- 2.  ACDF at to C3-4 and C5-6 is solid.  There is straightening of the normal cervical lordosis.  The soft tissues of the neck are unremarkable.  The lung apices are clear.  C2-3:  A shallow central disc protrusion is present.  There is no significant stenosis.  C3-4:  The patient is fused.  There is no significant stenosis.  C4-5:  A broad-based disc osteophyte complex distorts and partially flattens the ventral surface of the cord the canal is narrowed to 7.5 mm.  Uncovertebral spurring leads to moderate right and mild left foraminal stenosis.  C5-6:  Mild right foraminal narrowing is secondary to uncovertebral spurring.  The central canal is patent following fusion.  C6-7:  A prominent central disc protrusion contacts and distorts ventral surface of the cord.  The canal is narrowed to 7 mm.  Mild right foraminal stenosis is  due to uncovertebral spurring.  The left foramen is patent.  C7-T1:  A prominent right lateral soft disc protrusion extends into the right neural foramen.  Mild right facet hypertrophy is noted as well.  This leads to moderate right foraminal stenosis.  The left foramen is patent.  IMPRESSION:  1.  Shallow central disc protrusion at C2-3 without significant stenosis. 2.  Status post fusion at C3-4 without significant stenosis. 3.  Moderate central and right foraminal stenosis at C4-5. 4.  Mild left foraminal narrowing at C4-5. 5.  Mild residual right foraminal narrowing at C5-6 following fusion. 6.  Moderate central canal stenosis at C6-7. 7.  Mild right foraminal narrowing at C6-7. 8.  Prominent right lateral soft disc protrusion at C7-T1 leads to moderate right foraminal stenosis.  *RADIOLOGY REPORT*  CT MYELOGRAPHY LUMBAR SPINE  Technique:  CT imaging of the lumbar spine was performed after intrathecal contrast administration.  Multiplanar CT image reconstructions were also generated.  Findings:  The lumbar spine is imaged from the midbody of T12 through S2-3.  The conus medullaris terminates at L1-2, within normal limits.  Vertebral body heights are maintained.  Slight degenerative retrolisthesis is evident at L3-4.  Atherosclerotic calcifications are present within the distal aorta branch vessels without aneurysm.  Degenerative changes are noted in the SI joints bilaterally  with a vacuum phenomenon.  L1-2:  Mild facet hypertrophy is present.  There is no significant stenosis.  L2-3:  Mild facet hypertrophy is present.  There is slight disc bulging.  No significant stenosis is present.  L3-4:  A broad-based disc bulge is present.  Moderate facet hypertrophy is present bilaterally.  This leads to mild left lateral recess and foraminal narrowing.  L4-5:  A mild broad-based disc herniation is present.  Facet hypertrophy is present bilaterally.  This leads to mild lateral recess and foraminal narrowing bilaterally.  L5-S1:  Mild broad-based disc bulge is present.  Moderate facet hypertrophy is present bilaterally.  There is significant facet spurring on both sides.  This leads to moderate to severe foraminal stenosis.  The central canal is patent.  IMPRESSION:  1.  Moderate to severe foraminal stenosis bilaterally at L5-S1. 2.  Mild lateral recess and foraminal narrowing bilaterally at L4- 5. 3.  Mild left lateral recess and foraminal narrowing at L3-4. 4.  Multilevel facet hypertrophy.   Original Report Authenticated By: Marin Roberts, M.D.   Ct Lumbar Spine W Contrast  03/14/2013   *RADIOLOGY REPORT*  Clinical Data:  Diffuse neck pain extending into both upper extremities along the posterior aspect of the arm to the elbows. Low back pain extending into the posterior thighs bilaterally.  MYELOGRAM INJECTION  Technique:  Informed consent was obtained from the patient prior to the procedure, including potential complications of headache, allergy, infection and pain.  A timeout procedure was performed. With the patient prone, the lower back was prepped with Betadine. 1% Lidocaine was used for local anesthesia.  Lumbar puncture was performed at the right paramidline L2-3 level using a 22 low gauge needle with return of clear CSF.  10 ml of Omnipaque 300was injected into the subarachnoid space .  IMPRESSION: Successful injection of  intrathecal contrast for myelography.  MYELOGRAM  CERVICAL AND LUMBAR  Technique: Radiologist injection Following injection of intrathecal Omnipaque contrast, spine imaging in multiple projections was performed using fluoroscopy.  Fluoroscopy Time: 2 minutes.  14 seconds.  Comparison:  MRI of the cervical spine 01/06/2012.  Findings: The patient is status post ACDF at C3-4  and C5-6.  The cervical contrast is somewhat faint.  There is some blunting of the nerve roots bilaterally at C4-5 and C6-7.  Please see the CT examination for further detail.  The lumbar images demonstrate mild disc bulging at L3-4, L4-5, and L5-S1.  There is chronic loss of height at L5-S1.  There is no definite stenosis.  The nerve roots fill normally on both sides.  The upright images demonstrate no significant change in the disc bulging.  Alignment is maintained.  Atherosclerotic calcifications are present within the aorta without aneurysm.  IMPRESSION:  1.  Status post ACDF at to C3-4 and C5-6. 2.  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 3.  Mild disc bulging at L3-4, L4-5, and L5-S1 without definite stenosis. 4.  Atherosclerosis.  *RADIOLOGY REPORT*  CT MYELOGRAPHY CERVICAL SPINE  Technique:  CT imaging of the cervical spine was performed after intrathecal contrast administration. Multiplanar CT image reconstructions were also generated.  Findings:  The cervical spine is imaged from skull base through T1- 2.  ACDF at to C3-4 and C5-6 is solid.  There is straightening of the normal cervical lordosis.  The soft tissues of the neck are unremarkable.  The lung apices are clear.  C2-3:  A shallow central disc protrusion is present.  There is no significant stenosis.  C3-4:  The patient is fused. There is no significant stenosis.  C4-5:  A broad-based disc osteophyte complex distorts and partially flattens the ventral surface of the cord the canal is narrowed to 7.5 mm.  Uncovertebral spurring leads to moderate right and mild left foraminal stenosis.  C5-6:  Mild right foraminal  narrowing is secondary to uncovertebral spurring.  The central canal is patent following fusion.  C6-7:  A prominent central disc protrusion contacts and distorts ventral surface of the cord.  The canal is narrowed to 7 mm.  Mild right foraminal stenosis is due to uncovertebral spurring.  The left foramen is patent.  C7-T1:  A prominent right lateral soft disc protrusion extends into the right neural foramen.  Mild right facet hypertrophy is noted as well.  This leads to moderate right foraminal stenosis.  The left foramen is patent.  IMPRESSION:  1.  Shallow central disc protrusion at C2-3 without significant stenosis. 2.  Status post fusion at C3-4 without significant stenosis. 3.  Moderate central and right foraminal stenosis at C4-5. 4.  Mild left foraminal narrowing at C4-5. 5.  Mild residual right foraminal narrowing at C5-6 following fusion. 6.  Moderate central canal stenosis at C6-7. 7.  Mild right foraminal narrowing at C6-7. 8.  Prominent right lateral soft disc protrusion at C7-T1 leads to moderate right foraminal stenosis.  *RADIOLOGY REPORT*  CT MYELOGRAPHY LUMBAR SPINE  Technique:  CT imaging of the lumbar spine was performed after intrathecal contrast administration.  Multiplanar CT image reconstructions were also generated.  Findings:  The lumbar spine is imaged from the midbody of T12 through S2-3.  The conus medullaris terminates at L1-2, within normal limits.  Vertebral body heights are maintained.  Slight degenerative retrolisthesis is evident at L3-4.  Atherosclerotic calcifications are present within the distal aorta branch vessels without aneurysm.  Degenerative changes are noted in the SI joints bilaterally with a vacuum phenomenon.  L1-2:  Mild facet hypertrophy is present.  There is no significant stenosis.  L2-3:  Mild facet hypertrophy is present.  There is slight disc bulging.  No significant stenosis is present.  L3-4:  A broad-based disc bulge  is present.  Moderate facet hypertrophy  is present bilaterally.  This leads to mild left lateral recess and foraminal narrowing.  L4-5:  A mild broad-based disc herniation is present.  Facet hypertrophy is present bilaterally.  This leads to mild lateral recess and foraminal narrowing bilaterally.  L5-S1:  Mild broad-based disc bulge is present.  Moderate facet hypertrophy is present bilaterally.  There is significant facet spurring on both sides.  This leads to moderate to severe foraminal stenosis.  The central canal is patent.  IMPRESSION:  1.  Moderate to severe foraminal stenosis bilaterally at L5-S1. 2.  Mild lateral recess and foraminal narrowing bilaterally at L4- 5. 3.  Mild left lateral recess and foraminal narrowing at L3-4. 4.  Multilevel facet hypertrophy.   Original Report Authenticated By: Marin Roberts, M.D.   Dg Myelogram 2+ Regions  03/14/2013   *RADIOLOGY REPORT*  Clinical Data:  Diffuse neck pain extending into both upper extremities along the posterior aspect of the arm to the elbows. Low back pain extending into the posterior thighs bilaterally.  MYELOGRAM INJECTION  Technique:  Informed consent was obtained from the patient prior to the procedure, including potential complications of headache, allergy, infection and pain.  A timeout procedure was performed. With the patient prone, the lower back was prepped with Betadine. 1% Lidocaine was used for local anesthesia.  Lumbar puncture was performed at the right paramidline L2-3 level using a 22 low gauge needle with return of clear CSF.  10 ml of Omnipaque 300was injected into the subarachnoid space .  IMPRESSION: Successful injection of  intrathecal contrast for myelography.  MYELOGRAM CERVICAL AND LUMBAR  Technique: Radiologist injection Following injection of intrathecal Omnipaque contrast, spine imaging in multiple projections was performed using fluoroscopy.  Fluoroscopy Time: 2 minutes.  14 seconds.  Comparison:  MRI of the cervical spine 01/06/2012.  Findings: The  patient is status post ACDF at C3-4 and C5-6.  The cervical contrast is somewhat faint.  There is some blunting of the nerve roots bilaterally at C4-5 and C6-7.  Please see the CT examination for further detail.  The lumbar images demonstrate mild disc bulging at L3-4, L4-5, and L5-S1.  There is chronic loss of height at L5-S1.  There is no definite stenosis.  The nerve roots fill normally on both sides.  The upright images demonstrate no significant change in the disc bulging.  Alignment is maintained.  Atherosclerotic calcifications are present within the aorta without aneurysm.  IMPRESSION:  1.  Status post ACDF at to C3-4 and C5-6. 2.  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 3.  Mild disc bulging at L3-4, L4-5, and L5-S1 without definite stenosis. 4.  Atherosclerosis.  *RADIOLOGY REPORT*  CT MYELOGRAPHY CERVICAL SPINE  Technique:  CT imaging of the cervical spine was performed after intrathecal contrast administration. Multiplanar CT image reconstructions were also generated.  Findings:  The cervical spine is imaged from skull base through T1- 2.  ACDF at to C3-4 and C5-6 is solid.  There is straightening of the normal cervical lordosis.  The soft tissues of the neck are unremarkable.  The lung apices are clear.  C2-3:  A shallow central disc protrusion is present.  There is no significant stenosis.  C3-4:  The patient is fused. There is no significant stenosis.  C4-5:  A broad-based disc osteophyte complex distorts and partially flattens the ventral surface of the cord the canal is narrowed to 7.5 mm.  Uncovertebral spurring leads to moderate right and  mild left foraminal stenosis.  C5-6:  Mild right foraminal narrowing is secondary to uncovertebral spurring.  The central canal is patent following fusion.  C6-7:  A prominent central disc protrusion contacts and distorts ventral surface of the cord.  The canal is narrowed to 7 mm.  Mild right foraminal stenosis is due to uncovertebral spurring.   The left foramen is patent.  C7-T1:  A prominent right lateral soft disc protrusion extends into the right neural foramen.  Mild right facet hypertrophy is noted as well.  This leads to moderate right foraminal stenosis.  The left foramen is patent.  IMPRESSION:  1.  Shallow central disc protrusion at C2-3 without significant stenosis. 2.  Status post fusion at C3-4 without significant stenosis. 3.  Moderate central and right foraminal stenosis at C4-5. 4.  Mild left foraminal narrowing at C4-5. 5.  Mild residual right foraminal narrowing at C5-6 following fusion. 6.  Moderate central canal stenosis at C6-7. 7.  Mild right foraminal narrowing at C6-7. 8.  Prominent right lateral soft disc protrusion at C7-T1 leads to moderate right foraminal stenosis.  *RADIOLOGY REPORT*  CT MYELOGRAPHY LUMBAR SPINE  Technique:  CT imaging of the lumbar spine was performed after intrathecal contrast administration.  Multiplanar CT image reconstructions were also generated.  Findings:  The lumbar spine is imaged from the midbody of T12 through S2-3.  The conus medullaris terminates at L1-2, within normal limits.  Vertebral body heights are maintained.  Slight degenerative retrolisthesis is evident at L3-4.  Atherosclerotic calcifications are present within the distal aorta branch vessels without aneurysm.  Degenerative changes are noted in the SI joints bilaterally with a vacuum phenomenon.  L1-2:  Mild facet hypertrophy is present.  There is no significant stenosis.  L2-3:  Mild facet hypertrophy is present.  There is slight disc bulging.  No significant stenosis is present.  L3-4:  A broad-based disc bulge is present.  Moderate facet hypertrophy is present bilaterally.  This leads to mild left lateral recess and foraminal narrowing.  L4-5:  A mild broad-based disc herniation is present.  Facet hypertrophy is present bilaterally.  This leads to mild lateral recess and foraminal narrowing bilaterally.  L5-S1:  Mild broad-based disc  bulge is present.  Moderate facet hypertrophy is present bilaterally.  There is significant facet spurring on both sides.  This leads to moderate to severe foraminal stenosis.  The central canal is patent.  IMPRESSION:  1.  Moderate to severe foraminal stenosis bilaterally at L5-S1. 2.  Mild lateral recess and foraminal narrowing bilaterally at L4- 5. 3.  Mild left lateral recess and foraminal narrowing at L3-4. 4.  Multilevel facet hypertrophy.   Original Report Authenticated By: Marin Roberts, M.D.    Antibiotics:  Anti-infectives   Start     Dose/Rate Route Frequency Ordered Stop   04/04/13 2145  ceFAZolin (ANCEF) IVPB 1 g/50 mL premix     1 g 100 mL/hr over 30 Minutes Intravenous Every 8 hours 04/04/13 2133 04/05/13 0620   04/04/13 1518  bacitracin 50,000 Units in sodium chloride irrigation 0.9 % 500 mL irrigation  Status:  Discontinued       As needed 04/04/13 1518 04/04/13 1810   04/04/13 1418  ceFAZolin (ANCEF) 2-3 GM-% IVPB SOLR    Comments:  Sharlene Dory [facility]): cabinet override      04/04/13 1418 04/04/13 1445   04/04/13 1358  bacitracin 16109 UNITS injection    Comments:  DAY, DORY: cabinet override  04/04/13 1358 04/05/13 0159   04/04/13 0600  ceFAZolin (ANCEF) 2 g in dextrose 5 % 50 mL IVPB  Status:  Discontinued     2 g 140 mL/hr over 30 Minutes Intravenous On call to O.R. 04/03/13 1418 04/03/13 1418   04/04/13 0600  ceFAZolin (ANCEF) 2 g in dextrose 5 % 50 mL IVPB  Status:  Discontinued     2 g 140 mL/hr over 30 Minutes Intravenous On call to O.R. 04/03/13 1420 04/03/13 1420      Discharge Exam: Blood pressure 143/67, pulse 44, temperature 98.8 F (37.1 C), temperature source Oral, resp. rate 16, height 5\' 2"  (1.575 m), weight 91.173 kg (201 lb), SpO2 100.00%. Neurologic: Grossly normal incision CDI  Discharge Medications:     Medication List    STOP taking these medications       oxyCODONE-acetaminophen 5-325 MG per tablet  Commonly known as:   PERCOCET/ROXICET      TAKE these medications       diazepam 10 MG tablet  Commonly known as:  VALIUM  Take 10 mg by mouth daily as needed for anxiety or sleep.     estradiol 1 MG tablet  Commonly known as:  ESTRACE  Take 1 mg by mouth daily.     HYDROmorphone 2 MG tablet  Commonly known as:  DILAUDID  Take 1 tablet (2 mg total) by mouth every 4 (four) hours as needed.        Disposition: home   Final Dx: ACDF      Discharge Orders   Future Orders Complete By Expires     Call MD for:  difficulty breathing, headache or visual disturbances  As directed     Call MD for:  persistant nausea and vomiting  As directed     Call MD for:  redness, tenderness, or signs of infection (pain, swelling, redness, odor or green/yellow discharge around incision site)  As directed     Call MD for:  severe uncontrolled pain  As directed     Call MD for:  temperature >100.4  As directed     Diet - low sodium heart healthy  As directed     Discharge instructions  As directed     Comments:      No lifting more than 10 lbs, no driving, may shower    Increase activity slowly  As directed        Follow-up Information   Follow up with Theordore Cisnero S, MD. Schedule an appointment as soon as possible for a visit in 2 weeks.   Contact information:   [address] [place] [address] [phone]        Signed: Tia Alert 04/08/2013, 8:22 AM

## 2024-02-21 ENCOUNTER — Inpatient Hospital Stay: Payer: Self-pay

## 2024-02-21 ENCOUNTER — Encounter: Payer: Self-pay | Admitting: Hematology and Oncology

## 2024-02-21 ENCOUNTER — Other Ambulatory Visit: Payer: Self-pay | Admitting: Hematology and Oncology

## 2024-02-21 ENCOUNTER — Other Ambulatory Visit: Payer: Self-pay

## 2024-02-21 ENCOUNTER — Inpatient Hospital Stay: Payer: Self-pay | Attending: Hematology and Oncology | Admitting: Hematology and Oncology

## 2024-02-21 VITALS — BP 107/63 | HR 73 | Temp 98.3°F | Resp 18 | Ht 62.0 in | Wt 139.5 lb

## 2024-02-21 DIAGNOSIS — Z79899 Other long term (current) drug therapy: Secondary | ICD-10-CM | POA: Insufficient documentation

## 2024-02-21 DIAGNOSIS — Z79631 Long term (current) use of antimetabolite agent: Secondary | ICD-10-CM | POA: Insufficient documentation

## 2024-02-21 DIAGNOSIS — D649 Anemia, unspecified: Secondary | ICD-10-CM

## 2024-02-21 DIAGNOSIS — D509 Iron deficiency anemia, unspecified: Secondary | ICD-10-CM | POA: Diagnosis not present

## 2024-02-21 DIAGNOSIS — Z79624 Long term (current) use of inhibitors of nucleotide synthesis: Secondary | ICD-10-CM | POA: Diagnosis not present

## 2024-02-21 DIAGNOSIS — Z7982 Long term (current) use of aspirin: Secondary | ICD-10-CM | POA: Insufficient documentation

## 2024-02-21 LAB — CMP (CANCER CENTER ONLY)
ALT: 18 U/L (ref 0–44)
AST: 25 U/L (ref 15–41)
Albumin: 4.3 g/dL (ref 3.5–5.0)
Alkaline Phosphatase: 136 U/L — ABNORMAL HIGH (ref 38–126)
Anion gap: 13 (ref 5–15)
BUN: 15 mg/dL (ref 8–23)
CO2: 21 mmol/L — ABNORMAL LOW (ref 22–32)
Calcium: 10.3 mg/dL (ref 8.9–10.3)
Chloride: 107 mmol/L (ref 98–111)
Creatinine: 1.42 mg/dL — ABNORMAL HIGH (ref 0.44–1.00)
GFR, Estimated: 41 mL/min — ABNORMAL LOW (ref >60)
Glucose, Bld: 87 mg/dL (ref 70–99)
Potassium: 3.7 mmol/L (ref 3.5–5.1)
Sodium: 141 mmol/L (ref 135–145)
Total Bilirubin: 0.2 mg/dL (ref 0.0–1.2)
Total Protein: 8.2 g/dL — ABNORMAL HIGH (ref 6.5–8.1)

## 2024-02-21 LAB — CBC WITH DIFFERENTIAL (CANCER CENTER ONLY)
Abs Immature Granulocytes: 0.04 10*3/uL (ref 0.00–0.07)
Basophils Absolute: 0 10*3/uL (ref 0.0–0.1)
Basophils Relative: 0 %
Eosinophils Absolute: 0.1 10*3/uL (ref 0.0–0.5)
Eosinophils Relative: 1 %
HCT: 33 % — ABNORMAL LOW (ref 36.0–46.0)
Hemoglobin: 10.4 g/dL — ABNORMAL LOW (ref 12.0–15.0)
Immature Granulocytes: 0 %
Lymphocytes Relative: 20 %
Lymphs Abs: 2 10*3/uL (ref 0.7–4.0)
MCH: 24.6 pg — ABNORMAL LOW (ref 26.0–34.0)
MCHC: 31.5 g/dL (ref 30.0–36.0)
MCV: 78.2 fL — ABNORMAL LOW (ref 80.0–100.0)
Monocytes Absolute: 0.6 10*3/uL (ref 0.1–1.0)
Monocytes Relative: 6 %
Neutro Abs: 7.4 10*3/uL (ref 1.7–7.7)
Neutrophils Relative %: 73 %
Platelet Count: 305 10*3/uL (ref 150–400)
RBC: 4.22 MIL/uL (ref 3.87–5.11)
RDW: 15.3 % (ref 11.5–15.5)
WBC Count: 10 10*3/uL (ref 4.0–10.5)
nRBC: 0 % (ref 0.0–0.2)

## 2024-02-21 LAB — FOLATE: Folate: 5 ng/mL — ABNORMAL LOW (ref >5.9)

## 2024-02-21 LAB — IRON AND TIBC
Iron: 49 ug/dL (ref 28–170)
Saturation Ratios: 13 % (ref 10.4–31.8)
TIBC: 367 ug/dL (ref 250–450)
UIBC: 318 ug/dL

## 2024-02-21 LAB — VITAMIN B12: Vitamin B-12: 228 pg/mL (ref 180–914)

## 2024-02-21 LAB — FERRITIN: Ferritin: 358 ng/mL — ABNORMAL HIGH (ref 11–307)

## 2024-02-21 NOTE — Progress Notes (Cosign Needed)
 [facility] [address] [place],  [place]  [address] [phone]  Clinic Day:  02/21/2024   Referring physician: Almeda Jacobs, FNP  Patient Care Team: Patient Care Team: Almeda Jacobs, FNP as PCP - General (Family Medicine)   REASON FOR CONSULTATION:  Anemia HISTORY OF PRESENT ILLNESS:   Lynn Richardson is a 63 y.o. female with a history of anemia who is referred in consultation by Almeda Jacobs, FNP for assessment and management. She has required iron infusions in the past and states her last infusion was the end of last year. She has a complicated history including lung cancer for which she underwent surgery alone as well as colon cancer for which she states she received surgery alone. She has autoimmune hepatitis which is followed by her liver specialist at [facility]. Other medical history includes obesity, fatty liver, folate deficiency, B12 deficiency, kidney disease and depression/anxiety. She notes fatigue, shortness of breath, vision changes, cough and nausea/vomiting. Most recent EGD/ colonoscopy was negative and she denies blood in her stool or urine. She denies chills or signs of infection, but notes that it is difficult to assess by fever alone due to her Imuran.    REVIEW OF SYSTEMS:   Review of Systems  Constitutional:  Positive for fatigue.  HENT:  Negative.    Eyes: Negative.   Respiratory:  Positive for cough and shortness of breath.   Cardiovascular: Negative.   Gastrointestinal:  Positive for nausea and vomiting.  Endocrine: Negative.   Genitourinary: Negative.    Musculoskeletal: Negative.   Skin: Negative.   Neurological:  Positive for light-headedness.  Hematological: Negative.   Psychiatric/Behavioral: Negative.       VITALS:   There were no vitals taken for this visit.  Wt Readings from Last 3 Encounters:  04/05/13 201 lb (91.2 kg)  04/01/13 201 lb 1 oz (91.2 kg)  01/18/12 206 lb 9 oz (93.7 kg)    There is  no height or weight on file to calculate BMI.  Performance status (ECOG): 1 - Symptomatic but completely ambulatory  PHYSICAL EXAM:   Physical Exam Constitutional:      Appearance: Normal appearance. She is normal weight.  HENT:     Head: Normocephalic and atraumatic.     Mouth/Throat:     Mouth: Mucous membranes are moist.   Cardiovascular:     Rate and Rhythm: Normal rate and regular rhythm.     Pulses: Normal pulses.     Heart sounds: Normal heart sounds.  Pulmonary:     Effort: Pulmonary effort is normal.     Breath sounds: Normal breath sounds.  Abdominal:     Palpations: Abdomen is soft.   Musculoskeletal:        General: Normal range of motion.     Cervical back: Normal range of motion.   Skin:    General: Skin is warm and dry.   Neurological:     General: No focal deficit present.     Mental Status: She is alert and oriented to person, place, and time. Mental status is at baseline.   Psychiatric:        Mood and Affect: Mood normal.        Behavior: Behavior normal.        Thought Content: Thought content normal.        Judgment: Judgment normal.      LABS:      Latest Ref Rng & Units 02/21/2024    2:53 PM 04/01/2013  4:03 PM 01/18/2012    2:54 PM  CBC  WBC 4.0 - 10.5 K/uL 10.0  9.9  11.8   Hemoglobin 12.0 - 15.0 g/dL 16.1  09.6  04.5   Hematocrit 36.0 - 46.0 % 33.0  38.0  38.5   Platelets 150 - 400 K/uL 305  188  202       Latest Ref Rng & Units 04/01/2013    4:03 PM 01/18/2012    2:54 PM 01/30/2008   12:41 PM  CMP  Glucose 70 - 99 mg/dL 409  811  914   BUN 6 - 23 mg/dL 5  7  5    Creatinine 0.50 - 1.10 mg/dL 7.82  9.56  2.13   Sodium 135 - 145 mEq/L 141  138  144   Potassium 3.5 - 5.1 mEq/L 3.5  3.6  3.7   Chloride 96 - 112 mEq/L 104  101  106   CO2 19 - 32 mEq/L 28  27  28    Calcium 8.4 - 10.5 mg/dL 9.6  9.3  9.5      No results found for: CEA1, CEA / No results found for: CEA1, CEA No results found for: PSA1 No results found  for: YQM578 No results found for: ION629  No results found for: TOTALPROTELP, ALBUMINELP, A1GS, A2GS, BETS, BETA2SER, GAMS, MSPIKE, SPEI No results found for: TIBC, FERRITIN, IRONPCTSAT No results found for: LDH  STUDIES:   No results found.    HISTORY:   Past Medical History:  Diagnosis Date   Anxiety    Arthritis    Cervical post-laminectomy syndrome    Chronic pain    Colon cancer (HCC)    Depression    takes Valium  daily   Diabetes mellitus without complication (HCC)    Fibromyalgia    Headache(784.0)    last migraine at least 82yrs ago   Hepatitis, autoimmune (HCC)    History of colon polyps    Hypertension    Insomnia    Joint pain    Kidney disease    Lung nodule    Malignant neoplasm of upper lobe of left lung (HCC)    MRSA infection    Nonadherence to medication    Pancreatic lesion    Radiculopathy of cervicothoracic region    Renal insufficiency    Tenosynovitis, de Quervain    Thyroid nodule    Weakness    bilateral legs    Past Surgical History:  Procedure Laterality Date   ABDOMINAL HYSTERECTOMY  09/12/2000   ANTERIOR CERVICAL DECOMP/DISCECTOMY FUSION N/A 04/04/2013   Procedure: ANTERIOR CERVICAL DECOMPRESSION/DISCECTOMY FUSION 2 LEVEL/HARDWARE REMOVAL;  Surgeon: Isadora Mar, MD;  Location: [facility];  Service: Neurosurgery;  Laterality: N/A;  Cervical four-five,Cervical six-seven with hardware removal   APPENDECTOMY     BRONCHOSCOPY     CARPAL TUNNEL RELEASE     CERVICAL SPINE SURGERY  2008 and 2010   COLONOSCOPY     tumor removal, splenic flexure dx w/control of bleeding, w/ directed submucosal injections   HEMORRHOID SURGERY     INCISE AND DRAIN ABCESS     Perianal   KNEE ARTHROSCOPY  09/12/1978   LOBECTOMY Bilateral    OOPHORECTOMY     POSTERIOR CERVICAL LAMINECTOMY  01/20/2012   Procedure: POSTERIOR CERVICAL LAMINECTOMY;  Surgeon: Isadora Mar, MD;  Location: [facility];  Service: Neurosurgery;   Laterality: Right;  Right Cervical Seven to Thoracic One Foraminotomy/ Disckectomy   PROCTOSCOPY TRANSANAL RESECTION OF RECTAL TUMOR WITH TAMIS  SYSTEM     TONSILLECTOMY     TRIGGER FINGER RELEASE     UPPER GASTROINTESTINAL ENDOSCOPY     FN Needle Biopsy   XI ROBOTIC ASSISTED THORACOSCOPY- SEGMENTECTOMY      Family History  Problem Relation Age of Onset   Depression Mother    Heart disease Father    Diabetes Father    Arthritis Sister    Diabetes Sister    Diabetes Brother    Heart disease Brother    Heart disease Brother    Diabetes Brother    Arthritis Brother    Liver disease Brother    Liver cancer Brother    Lung cancer Brother    Asthma Daughter    Asthma Son     Social History:  reports that she quit smoking about 17 years ago. Her smoking use included cigarettes. She started smoking about 47 years ago. She has a 60 pack-year smoking history. She has never used smokeless tobacco. She reports that she does not drink alcohol and does not use drugs.The patient is alone  today.  Allergies:  Allergies  Allergen Reactions   Doxepin Palpitations    Could not sleep  Felt like she was on Speed  Could not sleep   Felt like she was on Speed   Lisinopril Cough and Other (See Comments)    Cough  Ace inhibitor cough  Ace inhibitor   Silicone Dermatitis    Only when remains for prolonged periods of time    Current Medications: Current Outpatient Medications  Medication Sig Dispense Refill   acetaminophen  (TYLENOL ) 500 MG tablet Take 1,000 mg by mouth every 6 (six) hours as needed.     amLODipine (NORVASC) 5 MG tablet Take 1 tablet by mouth daily.     aspirin EC 81 MG tablet Take 1 tablet by mouth daily.     azaTHIOprine (IMURAN) 50 MG tablet Take 25 mg by mouth daily.     diazepam  (VALIUM ) 5 MG tablet Take 10 mg by mouth every 8 (eight) hours as needed.     Meclizine HCl 25 MG CHEW Chew 25 mg by mouth 3 (three) times daily.     oxyCODONE  (OXY IR/ROXICODONE ) 5 MG  immediate release tablet Take 5 mg by mouth every 4 (four) hours as needed.     Semaglutide, 1 MG/DOSE, (OZEMPIC, 1 MG/DOSE,) 4 MG/3ML SOPN Inject 1 mg into the skin once a week.     triamcinolone (KENALOG) 0.1 % paste      zolpidem (AMBIEN) 5 MG tablet Take 5 mg by mouth at bedtime as needed.     No current facility-administered medications for this visit.     ASSESSMENT & PLAN:   Assessment:  SATARA VIRELLA is a 63 y.o. female with history of iron deficiency anemia for which she has undergone iron infusions in the past. CBC today reveals hemoglobin 10.4. Iron studies reveal saturation 13, ferritin 358, folate 5.0, and B12 228. We discussed her previous infusions and since she has tolerated Venofer well in the past, we will plan for this. She will also begin folic acid 1 mg daily.   Plan: 1.  Plan Venofer x 5 doses, folic acid daily and repeat evaluation 4 weeks after last iron infusion.   I discussed the assessment and treatment plan with the patient.  The patient was provided an opportunity to ask questions and all were answered.  The patient agreed with the plan and demonstrated an understanding of the instructions.  Thank you for the referral    45 minutes was spent in patient care.  This included time spent preparing to see the patient (e.g., review of tests), obtaining and/or reviewing separately obtained history, counseling and educating the patient/family/caregiver, ordering medications, tests, or procedures; documenting clinical information in the electronic or other health record, independently interpreting results and communicating results to the patient/family/caregiver as well as coordination of care.      Adelaide Adjutant, NP   Nurse Practitioner - Board Certified [facility] [place] [phone]

## 2024-02-23 ENCOUNTER — Telehealth: Payer: Self-pay

## 2024-02-23 NOTE — Telephone Encounter (Signed)
 Clinical Social Work was referred by medical provider for assessment of psychosocial needs.  CSW attempted to contact patient by phone.  Left voicemail with contact information and request for return call.

## 2024-03-07 ENCOUNTER — Encounter: Payer: Self-pay | Admitting: Hematology and Oncology

## 2024-03-07 ENCOUNTER — Other Ambulatory Visit: Payer: Self-pay | Admitting: Hematology and Oncology

## 2024-03-07 DIAGNOSIS — D509 Iron deficiency anemia, unspecified: Secondary | ICD-10-CM | POA: Insufficient documentation

## 2024-05-09 ENCOUNTER — Telehealth: Admitting: Genetic Counselor
